# Patient Record
Sex: Female | Born: 1955 | Race: White | Hispanic: No | Marital: Married | Smoking: Never smoker
Health system: Southern US, Community
[De-identification: ages and names within clinical notes are randomized; demographics above are authoritative.]

## PROBLEM LIST (undated history)

## (undated) DIAGNOSIS — M1990 Unspecified osteoarthritis, unspecified site: Secondary | ICD-10-CM

## (undated) DIAGNOSIS — Z9889 Other specified postprocedural states: Secondary | ICD-10-CM

## (undated) DIAGNOSIS — R112 Nausea with vomiting, unspecified: Secondary | ICD-10-CM

## (undated) DIAGNOSIS — H5500 Unspecified nystagmus: Secondary | ICD-10-CM

## (undated) DIAGNOSIS — E119 Type 2 diabetes mellitus without complications: Secondary | ICD-10-CM

## (undated) DIAGNOSIS — R42 Dizziness and giddiness: Secondary | ICD-10-CM

## (undated) DIAGNOSIS — I10 Essential (primary) hypertension: Secondary | ICD-10-CM

## (undated) HISTORY — DX: Dizziness and giddiness: R42

## (undated) HISTORY — DX: Unspecified nystagmus: H55.00

## (undated) HISTORY — DX: Type 2 diabetes mellitus without complications: E11.9

## (undated) HISTORY — PX: TONSILLECTOMY: SUR1361

## (undated) HISTORY — PX: OTHER SURGICAL HISTORY: SHX169

## (undated) HISTORY — PX: ADENOIDECTOMY: SUR15

---

## 2001-05-31 ENCOUNTER — Encounter: Admission: RE | Admit: 2001-05-31 | Discharge: 2001-05-31 | Payer: Self-pay | Admitting: Family Medicine

## 2001-05-31 ENCOUNTER — Encounter: Payer: Self-pay | Admitting: Family Medicine

## 2003-06-03 ENCOUNTER — Encounter: Admission: RE | Admit: 2003-06-03 | Discharge: 2003-06-03 | Payer: Self-pay | Admitting: Rheumatology

## 2003-06-03 ENCOUNTER — Encounter: Payer: Self-pay | Admitting: Rheumatology

## 2006-03-02 ENCOUNTER — Encounter: Admission: RE | Admit: 2006-03-02 | Discharge: 2006-03-02 | Payer: Self-pay | Admitting: Orthopedic Surgery

## 2009-04-20 ENCOUNTER — Encounter: Admission: RE | Admit: 2009-04-20 | Discharge: 2009-04-20 | Payer: Self-pay | Admitting: Radiation Oncology

## 2009-11-20 HISTORY — PX: CERVICAL FUSION: SHX112

## 2010-05-26 ENCOUNTER — Encounter: Admission: RE | Admit: 2010-05-26 | Discharge: 2010-05-26 | Payer: Self-pay | Admitting: Neurosurgery

## 2010-07-29 ENCOUNTER — Ambulatory Visit (HOSPITAL_COMMUNITY): Admission: RE | Admit: 2010-07-29 | Discharge: 2010-07-30 | Payer: Self-pay | Admitting: Neurosurgery

## 2010-09-06 ENCOUNTER — Encounter: Admission: RE | Admit: 2010-09-06 | Discharge: 2010-09-06 | Payer: Self-pay | Admitting: Neurosurgery

## 2010-10-11 ENCOUNTER — Encounter: Admission: RE | Admit: 2010-10-11 | Discharge: 2010-10-11 | Payer: Self-pay | Admitting: Neurosurgery

## 2010-10-20 HISTORY — PX: OTHER SURGICAL HISTORY: SHX169

## 2010-12-13 ENCOUNTER — Encounter
Admission: RE | Admit: 2010-12-13 | Discharge: 2010-12-13 | Payer: Self-pay | Source: Home / Self Care | Attending: Neurosurgery | Admitting: Neurosurgery

## 2011-01-06 IMAGING — CR DG CERVICAL SPINE 1V
1 series · 1 of 1 positions shown · non-contrast
Comparison: 07/29/2010

CLINICAL DATA: Surgical cervical fusion

CERVICAL SPINE - 1 VIEW

[w c-spine lat]
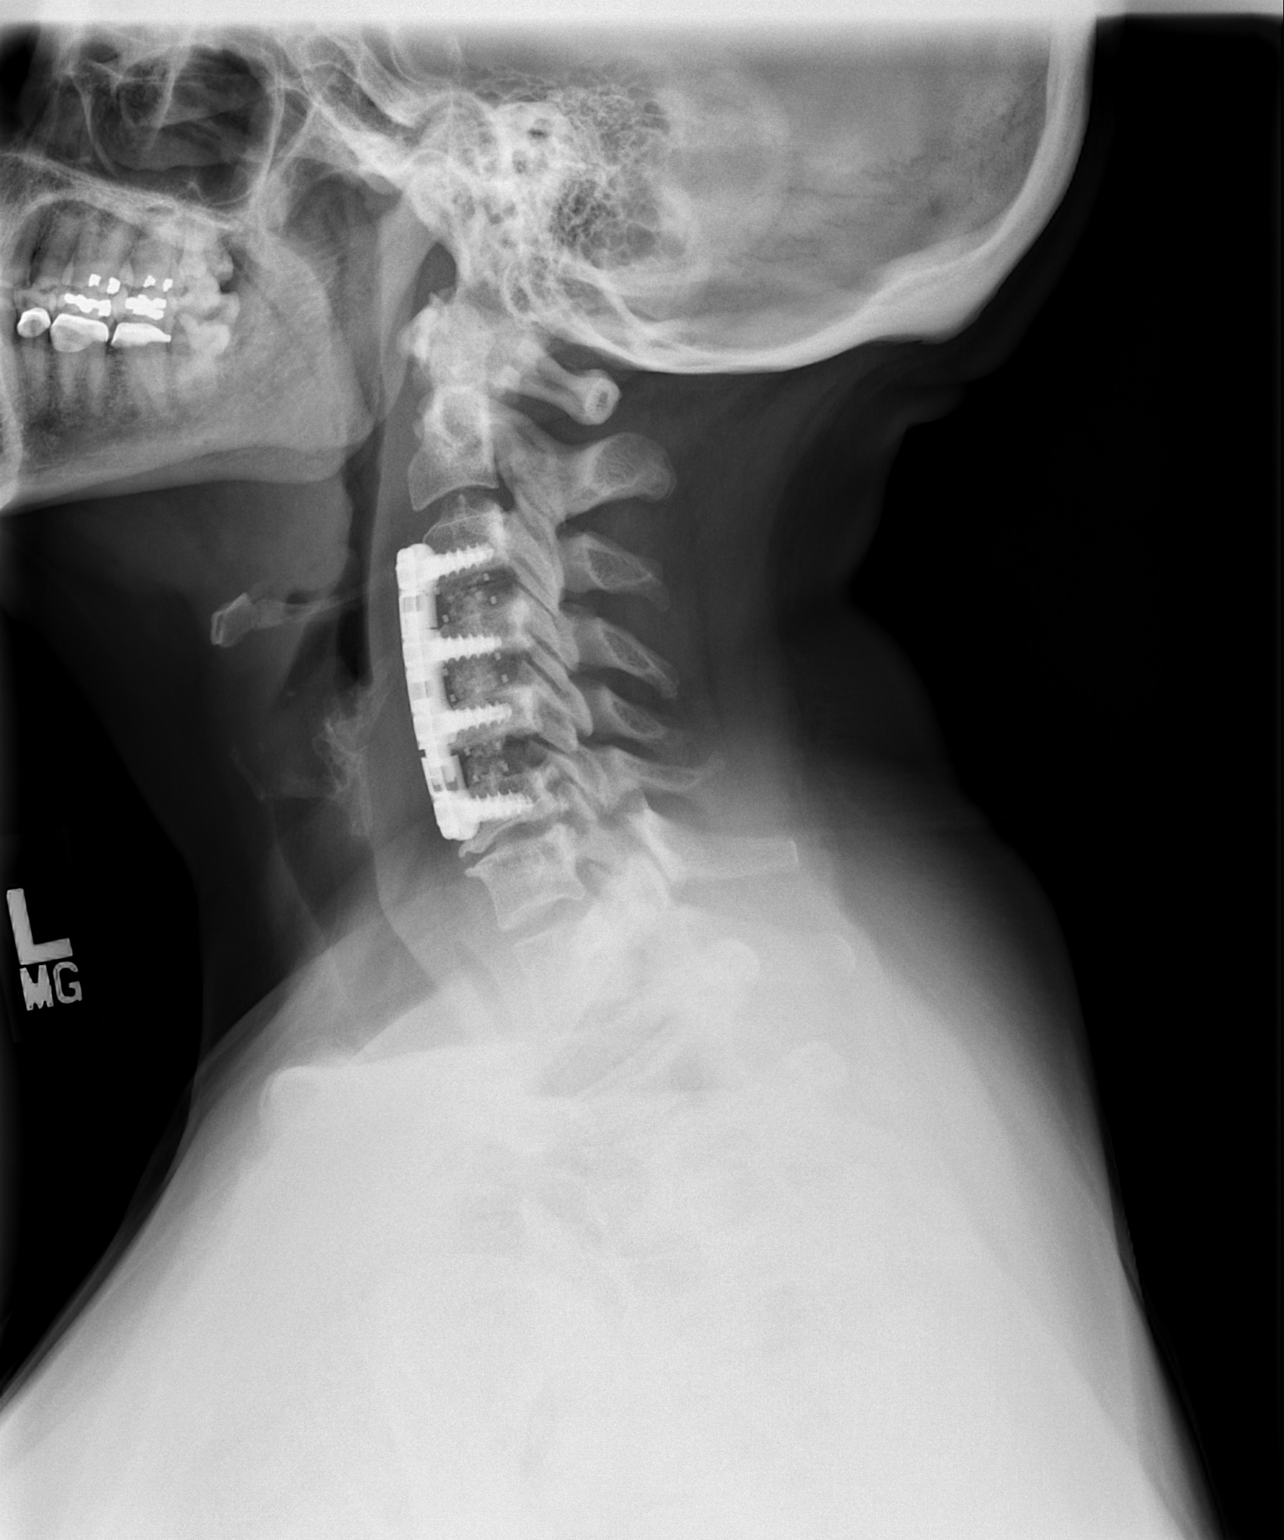

[1 of 1 positions shown; findings below may reference images not displayed]

FINDINGS: Single view demonstrates anterior plate and screws within
C3, C4, C5, and C6.  Interposed disc spacers are in place.  Based
on the lateral view, disc spacers and hardware are properly
positioned.  There is no breakage or loosening of the hardware.
Anatomic alignment.  There is significant narrowing of the C6-7
disc space with posterior osteophytic ridging.
IMPRESSION: Anterior discectomy and fusion C3-C6.  Anatomic alignment  and no
evidence of hardware failure.

C6-7 degenerative disc disease.

## 2011-02-02 LAB — CBC
HCT: 38.6 % (ref 36.0–46.0)
Hemoglobin: 12.8 g/dL (ref 12.0–15.0)
RBC: 4.16 MIL/uL (ref 3.87–5.11)

## 2011-02-02 LAB — BASIC METABOLIC PANEL
CO2: 29 mEq/L (ref 19–32)
Calcium: 9.3 mg/dL (ref 8.4–10.5)
GFR calc Af Amer: >60 mL/min (ref >60)
GFR calc non Af Amer: >60 mL/min (ref >60)
Potassium: 3.5 mEq/L (ref 3.5–5.1)
Sodium: 138 mEq/L (ref 135–145)

## 2011-02-02 LAB — SURGICAL PCR SCREEN
MRSA, PCR: NEGATIVE
Staphylococcus aureus: NEGATIVE

## 2011-04-04 ENCOUNTER — Other Ambulatory Visit: Payer: Self-pay | Admitting: Neurosurgery

## 2011-04-04 ENCOUNTER — Ambulatory Visit
Admission: RE | Admit: 2011-04-04 | Discharge: 2011-04-04 | Disposition: A | Discharge status: Home / Self Care | Payer: 59 | Source: Ambulatory Visit | Attending: Neurosurgery | Admitting: Neurosurgery

## 2011-04-04 DIAGNOSIS — Z9889 Other specified postprocedural states: Secondary | ICD-10-CM

## 2011-09-05 ENCOUNTER — Other Ambulatory Visit: Payer: Self-pay | Admitting: Neurosurgery

## 2011-09-05 ENCOUNTER — Ambulatory Visit
Admission: RE | Admit: 2011-09-05 | Discharge: 2011-09-05 | Disposition: A | Discharge status: Home / Self Care | Payer: 59 | Source: Ambulatory Visit | Attending: Neurosurgery | Admitting: Neurosurgery

## 2011-09-05 DIAGNOSIS — M542 Cervicalgia: Secondary | ICD-10-CM

## 2012-10-07 ENCOUNTER — Encounter (HOSPITAL_COMMUNITY): Payer: Self-pay | Admitting: Pharmacy Technician

## 2012-10-08 ENCOUNTER — Other Ambulatory Visit: Payer: Self-pay | Admitting: Neurosurgery

## 2012-10-08 ENCOUNTER — Ambulatory Visit
Admission: RE | Admit: 2012-10-08 | Discharge: 2012-10-08 | Disposition: A | Discharge status: Home / Self Care | Payer: 59 | Source: Ambulatory Visit | Attending: Neurosurgery | Admitting: Neurosurgery

## 2012-10-08 DIAGNOSIS — M5030 Other cervical disc degeneration, unspecified cervical region: Secondary | ICD-10-CM

## 2012-10-08 NOTE — Patient Instructions (Addendum)
[address]  10/08/2012   Your procedure is scheduled on: 10/15/12  Report to [facility] at 05:15 AM.  Call this number if you have problems the morning of surgery 336-: [phone]   Remember:   Do not eat food or drink liquids After Midnight.     Take these medicines the morning of surgery with A SIP OF WATER: no medications to take   See [facility] preparing for surgery sheet.  Do not wear jewelry, make-up or nail polish.  Do not wear lotions, powders, or perfumes. You may wear deodorant.  Do not shave 48 hours prior to surgery. Men may shave face and neck.  Do not bring valuables to the hospital.  Contacts, dentures or bridgework may not be worn into surgery.  Leave suitcase in the car. After surgery it may be brought to your room.  For patients admitted to the hospital, checkout time is 11:00 AM the day of discharge.   Patients discharged the day of surgery will not be allowed to drive home.  Name and phone number of your driver:  Special Instructions: Shower using CHG 2 nights before surgery and the night before surgery.  If you shower the day of surgery use CHG.  Use special wash - you have one bottle of CHG for all showers.  You should use approximately 1/3 of the bottle for each shower.   Please read over the following fact sheets that you were given: MRSA Information, blood fact sheet, incentive spirometer fact sheet Birdie Sons, RN  pre op nurse call if needed [phone]

## 2012-10-09 ENCOUNTER — Encounter (HOSPITAL_COMMUNITY)
Admission: RE | Admit: 2012-10-09 | Discharge: 2012-10-09 | Disposition: A | Discharge status: Home / Self Care | Payer: 59 | Source: Ambulatory Visit | Attending: Orthopedic Surgery | Admitting: Orthopedic Surgery

## 2012-10-09 ENCOUNTER — Ambulatory Visit (HOSPITAL_COMMUNITY)
Admission: RE | Admit: 2012-10-09 | Discharge: 2012-10-09 | Disposition: A | Discharge status: Home / Self Care | Payer: 59 | Source: Ambulatory Visit | Attending: Orthopedic Surgery | Admitting: Orthopedic Surgery

## 2012-10-09 ENCOUNTER — Encounter (HOSPITAL_COMMUNITY): Payer: Self-pay

## 2012-10-09 DIAGNOSIS — I10 Essential (primary) hypertension: Secondary | ICD-10-CM | POA: Insufficient documentation

## 2012-10-09 DIAGNOSIS — Z01812 Encounter for preprocedural laboratory examination: Secondary | ICD-10-CM | POA: Insufficient documentation

## 2012-10-09 DIAGNOSIS — M1710 Unilateral primary osteoarthritis, unspecified knee: Secondary | ICD-10-CM | POA: Insufficient documentation

## 2012-10-09 HISTORY — DX: Nausea with vomiting, unspecified: R11.2

## 2012-10-09 HISTORY — DX: Nausea with vomiting, unspecified: Z98.890

## 2012-10-09 HISTORY — DX: Unspecified osteoarthritis, unspecified site: M19.90

## 2012-10-09 HISTORY — DX: Essential (primary) hypertension: I10

## 2012-10-09 LAB — CBC
HCT: 39 % (ref 36.0–46.0)
Hemoglobin: 13.2 g/dL (ref 12.0–15.0)
RBC: 4.22 MIL/uL (ref 3.87–5.11)
WBC: 7.9 10*3/uL (ref 4.0–10.5)

## 2012-10-09 LAB — SURGICAL PCR SCREEN: Staphylococcus aureus: NEGATIVE

## 2012-10-09 LAB — URINALYSIS, ROUTINE W REFLEX MICROSCOPIC
Leukocytes, UA: NEGATIVE
Nitrite: NEGATIVE
Specific Gravity, Urine: 1.018 (ref 1.005–1.030)
Urobilinogen, UA: 0.2 mg/dL (ref 0.0–1.0)
pH: 5.5 (ref 5.0–8.0)

## 2012-10-09 LAB — PROTIME-INR: INR: 0.93 (ref 0.00–1.49)

## 2012-10-09 LAB — APTT: aPTT: 35 seconds (ref 24–37)

## 2012-10-09 LAB — BASIC METABOLIC PANEL
Chloride: 100 mEq/L (ref 96–112)
GFR calc Af Amer: >90 mL/min (ref >90)
Potassium: 4.6 mEq/L (ref 3.5–5.1)
Sodium: 137 mEq/L (ref 135–145)

## 2012-10-09 NOTE — Progress Notes (Signed)
EKG on 09/25/12 from Dr. Zachery Dauer on chart.

## 2012-10-09 NOTE — Progress Notes (Signed)
10/09/12 0851  OBSTRUCTIVE SLEEP APNEA  Have you ever been diagnosed with sleep apnea through a sleep study? No  Do you snore loudly (loud enough to be heard through closed doors)?  1  Do you often feel tired, fatigued, or sleepy during the daytime? 0  Has anyone observed you stop breathing during your sleep? 0  Do you have, or are you being treated for high blood pressure? 1  BMI more than 35 kg/m2? 1  Age over 56 years old? 1  Neck circumference greater than 40 cm/18 inches? 0  Gender: 0  Obstructive Sleep Apnea Score 4   Score 4 or greater  Results sent to PCP

## 2012-10-13 NOTE — H&P (Signed)
TOTAL KNEE ADMISSION H&P  Patient is being admitted for right total knee arthroplasty and left knee arthroscopic debridement.  Subjective:  Chief Complaint:bilaterally knee OA / pain.  HPI: Betty Robinson, 56 y.o. female, has a history of pain and functional disability in the right knees due to arthritis and has failed non-surgical conservative treatments for greater than 12 weeks to includeNSAID's and/or analgesics, corticosteriod injections, viscosupplementation injections and activity modification.  Onset of symptoms was gradual, starting 5 years ago with gradually worsening course since that time. The patient noted no past surgery on the right knee(s).  Patient currently rates pain in the right knee(s) at 8 out of 10 with activity. Patient has worsening of pain with activity and weight bearing, pain that interferes with activities of daily living, crepitus and joint swelling.  Patient has evidence of periarticular osteophytes and joint space narrowing by imaging studies.  There is no active infection. Also complaining of left knee pain for about 1 year. Had a previous arthroscopy to clean up the left knee. Studies reveal preserved joint space within the left knee. Previous arthroscopy revealed chondral injuries and possible worsening of the disease is causing the more recent pain. She wishes to proceed with a right total knee arthroplasty and left knee arthroscopy. Risks, benefits and expectations were discussed with the patient. Patient understand the risks, benefits and expectations and wishes to proceed with surgery.    D/C Plans:  Home with HHPT  Post-op Meds:   Rx given for ASA, Robaxin, Celebrex, Iron, Colace and MiraLax  Tranexamic Acid:  To be given  Decadron:   To be given   Past Medical History  Diagnosis Date  . Hypertension   . Arthritis   . PONV (postoperative nausea and vomiting)     limited turning neck side to side since cervical fusion    Past Surgical History  Procedure  Date  . Cervical fusion 2011    C2 C3 C4  . Bilateral knee arthroscopic R 6 years ago L June 2013  . Left foot surgery Dec. 2011    No prescriptions prior to admission   No Known Allergies  History  Substance Use Topics  . Smoking status: Never Smoker   . Smokeless tobacco: Never Used  . Alcohol Use: Yes     Comment: rare       Review of Systems  Constitutional: Negative.   HENT: Negative.   Eyes: Negative.   Respiratory: Negative.   Cardiovascular: Negative.   Gastrointestinal: Negative.   Genitourinary: Negative.   Musculoskeletal: Positive for joint pain.  Skin: Negative.   Neurological: Negative.   Endo/Heme/Allergies: Negative.   Psychiatric/Behavioral: Negative.     Objective:  Physical Exam  Constitutional: She is oriented to person, place, and time. She appears well-developed and well-nourished.  HENT:  Head: Normocephalic and atraumatic.  Mouth/Throat: Oropharynx is clear and moist.  Eyes: Pupils are equal, round, and reactive to light.  Neck: Neck supple. No JVD present. No tracheal deviation present. No thyromegaly present.  Cardiovascular: Normal rate, regular rhythm and intact distal pulses.   Murmur heard. Respiratory: Effort normal and breath sounds normal. No stridor. No respiratory distress. She has no wheezes.  GI: Soft. There is no tenderness. There is no guarding.  Musculoskeletal:       Right knee: She exhibits decreased range of motion, swelling, abnormal alignment and bony tenderness. She exhibits no effusion, no laceration and no erythema. tenderness found. Medial joint line tenderness noted.  Left knee: She exhibits decreased range of motion, swelling, abnormal alignment and bony tenderness. She exhibits no effusion and no ecchymosis. tenderness found. Medial joint line tenderness noted.  Lymphadenopathy:    She has no cervical adenopathy.  Neurological: She is alert and oriented to person, place, and time.  Skin: Skin is warm and dry.   Psychiatric: She has a normal mood and affect.      Imaging Review Plain radiographs demonstrate severe degenerative joint disease of the right knee(s). The overall alignment is mild varus. The bone quality appears to be good for age and reported activity level.  Assessment/Plan:  End stage arthritis, right knee and arthritic changes of the left knee  The patient history, physical examination, clinical judgment of the provider and imaging studies are consistent with end stage degenerative joint disease of the bilaterally knee(s) and total knee arthroplasty is deemed medically necessary. The treatment options including medical management, injection therapy arthroscopy and arthroplasty were discussed at length. The risks and benefits of total knee arthroplasty were presented and reviewed. The risks due to aseptic loosening, infection, stiffness, patella tracking problems, thromboembolic complications and other imponderables were discussed. The patient acknowledged the explanation, agreed to proceed with the plan and consent was signed. Patient is being admitted for inpatient treatment for surgery, pain control, PT, OT, prophylactic antibiotics, VTE prophylaxis, progressive ambulation and ADL's and discharge planning. The patient is planning to be discharged home with home health services.     Anastasio Auerbach Demetrias Goodbar   PAC  10/13/2012, 4:46 PM

## 2012-10-15 ENCOUNTER — Ambulatory Visit (HOSPITAL_COMMUNITY): Payer: 59 | Admitting: Registered Nurse

## 2012-10-15 ENCOUNTER — Encounter (HOSPITAL_COMMUNITY): Payer: Self-pay | Admitting: Registered Nurse

## 2012-10-15 ENCOUNTER — Encounter (HOSPITAL_COMMUNITY)
Admission: RE | Disposition: A | Discharge status: Home Health | Payer: Self-pay | Source: Ambulatory Visit | Attending: Orthopedic Surgery

## 2012-10-15 ENCOUNTER — Encounter (HOSPITAL_COMMUNITY): Payer: Self-pay | Admitting: Orthopedic Surgery

## 2012-10-15 ENCOUNTER — Inpatient Hospital Stay (HOSPITAL_COMMUNITY)
Admission: RE | Admit: 2012-10-15 | Discharge: 2012-10-17 | DRG: 470 | Disposition: A | Discharge status: Home Health | Payer: 59 | Source: Ambulatory Visit | Attending: Orthopedic Surgery | Admitting: Orthopedic Surgery

## 2012-10-15 ENCOUNTER — Encounter (HOSPITAL_COMMUNITY): Payer: Self-pay | Admitting: *Deleted

## 2012-10-15 DIAGNOSIS — D62 Acute posthemorrhagic anemia: Secondary | ICD-10-CM | POA: Diagnosis not present

## 2012-10-15 DIAGNOSIS — Z96659 Presence of unspecified artificial knee joint: Secondary | ICD-10-CM

## 2012-10-15 DIAGNOSIS — Z6839 Body mass index (BMI) 39.0-39.9, adult: Secondary | ICD-10-CM

## 2012-10-15 DIAGNOSIS — D500 Iron deficiency anemia secondary to blood loss (chronic): Secondary | ICD-10-CM | POA: Diagnosis not present

## 2012-10-15 DIAGNOSIS — Z9889 Other specified postprocedural states: Secondary | ICD-10-CM

## 2012-10-15 DIAGNOSIS — I10 Essential (primary) hypertension: Secondary | ICD-10-CM | POA: Diagnosis present

## 2012-10-15 DIAGNOSIS — E669 Obesity, unspecified: Secondary | ICD-10-CM | POA: Diagnosis present

## 2012-10-15 DIAGNOSIS — M1710 Unilateral primary osteoarthritis, unspecified knee: Principal | ICD-10-CM | POA: Diagnosis present

## 2012-10-15 DIAGNOSIS — M2240 Chondromalacia patellae, unspecified knee: Secondary | ICD-10-CM | POA: Diagnosis present

## 2012-10-15 DIAGNOSIS — M23359 Other meniscus derangements, posterior horn of lateral meniscus, unspecified knee: Secondary | ICD-10-CM | POA: Diagnosis present

## 2012-10-15 HISTORY — PX: KNEE ARTHROSCOPY: SHX127

## 2012-10-15 HISTORY — PX: TOTAL KNEE ARTHROPLASTY: SHX125

## 2012-10-15 LAB — TYPE AND SCREEN: ABO/RH(D): O POS

## 2012-10-15 LAB — ABO/RH: ABO/RH(D): O POS

## 2012-10-15 SURGERY — ARTHROPLASTY, KNEE, TOTAL
Anesthesia: Spinal | Site: Knee | Laterality: Right | Wound class: Clean

## 2012-10-15 MED ORDER — KETAMINE HCL 10 MG/ML IJ SOLN
INTRAMUSCULAR | Status: DC | PRN
  Administered 2012-10-15 (×2): 20 mg via INTRAVENOUS
  Administered 2012-10-15: 10 mg via INTRAVENOUS

## 2012-10-15 MED ORDER — LISINOPRIL 10 MG PO TABS
10.0000 mg | ORAL_TABLET | Freq: Every day | ORAL | Status: DC
  Administered 2012-10-16: 10 mg via ORAL
  Filled 2012-10-15 (×2): qty 1

## 2012-10-15 MED ORDER — POLYETHYLENE GLYCOL 3350 17 G PO PACK: 17.0000 g | PACK | Freq: Every day | ORAL | Status: DC | PRN

## 2012-10-15 MED ORDER — LIDOCAINE-EPINEPHRINE 1 %-1:100000 IJ SOLN
INTRAMUSCULAR | Status: AC
  Filled 2012-10-15: qty 1

## 2012-10-15 MED ORDER — TRANEXAMIC ACID 100 MG/ML IV SOLN
1420.0000 mg | Freq: Once | INTRAVENOUS | Status: AC
  Administered 2012-10-15: 1420 mg via INTRAVENOUS
  Filled 2012-10-15: qty 14.2

## 2012-10-15 MED ORDER — 0.9 % SODIUM CHLORIDE (POUR BTL) OPTIME
TOPICAL | Status: DC | PRN
  Administered 2012-10-15: 1000 mL

## 2012-10-15 MED ORDER — HYDROMORPHONE HCL PF 1 MG/ML IJ SOLN
0.0500 mg | INTRAMUSCULAR | Status: DC | PRN
  Administered 2012-10-15 (×2): 1 mg via INTRAVENOUS
  Administered 2012-10-16: 0.5 mg via INTRAVENOUS
  Filled 2012-10-15 (×3): qty 1

## 2012-10-15 MED ORDER — METHOCARBAMOL 100 MG/ML IJ SOLN
500.0000 mg | Freq: Four times a day (QID) | INTRAVENOUS | Status: DC | PRN
  Administered 2012-10-15: 500 mg via INTRAVENOUS
  Filled 2012-10-15: qty 5

## 2012-10-15 MED ORDER — HYDROCHLOROTHIAZIDE 12.5 MG PO CAPS
12.5000 mg | ORAL_CAPSULE | Freq: Every day | ORAL | Status: DC
  Administered 2012-10-16: 12.5 mg via ORAL
  Filled 2012-10-15 (×2): qty 1

## 2012-10-15 MED ORDER — PHENOL 1.4 % MT LIQD: 1.0000 | OROMUCOSAL | Status: DC | PRN

## 2012-10-15 MED ORDER — ACETAMINOPHEN 10 MG/ML IV SOLN
INTRAVENOUS | Status: AC
  Filled 2012-10-15: qty 100

## 2012-10-15 MED ORDER — AMLODIPINE BESYLATE 5 MG PO TABS
5.0000 mg | ORAL_TABLET | Freq: Every evening | ORAL | Status: DC
Start: 2012-10-15 — End: 2012-10-17
  Administered 2012-10-15 – 2012-10-16 (×2): 5 mg via ORAL
  Filled 2012-10-15 (×3): qty 1

## 2012-10-15 MED ORDER — MIDAZOLAM HCL 5 MG/5ML IJ SOLN
INTRAMUSCULAR | Status: DC | PRN
  Administered 2012-10-15: 2 mg via INTRAVENOUS
  Administered 2012-10-15 (×2): 1 mg via INTRAVENOUS

## 2012-10-15 MED ORDER — SODIUM CHLORIDE 0.9 % IR SOLN
Status: DC | PRN
  Administered 2012-10-15: 3000 mL

## 2012-10-15 MED ORDER — HYDROMORPHONE HCL PF 1 MG/ML IJ SOLN
0.2000 mg | INTRAMUSCULAR | Status: DC | PRN
  Administered 2012-10-15: 0.6 mg via INTRAVENOUS
  Filled 2012-10-15: qty 1

## 2012-10-15 MED ORDER — ALUM & MAG HYDROXIDE-SIMETH 200-200-20 MG/5ML PO SUSP: 30.0000 mL | ORAL | Status: DC | PRN

## 2012-10-15 MED ORDER — LIDOCAINE-EPINEPHRINE 1 %-1:100000 IJ SOLN
INTRAMUSCULAR | Status: DC | PRN
  Administered 2012-10-15: 30 mL

## 2012-10-15 MED ORDER — CEFAZOLIN SODIUM-DEXTROSE 2-3 GM-% IV SOLR
INTRAVENOUS | Status: AC
  Filled 2012-10-15: qty 50

## 2012-10-15 MED ORDER — METHOCARBAMOL 500 MG PO TABS
500.0000 mg | ORAL_TABLET | Freq: Four times a day (QID) | ORAL | Status: DC | PRN
  Administered 2012-10-15 – 2012-10-17 (×4): 500 mg via ORAL
  Filled 2012-10-15 (×4): qty 1

## 2012-10-15 MED ORDER — KETOROLAC TROMETHAMINE 30 MG/ML IJ SOLN
INTRAMUSCULAR | Status: DC | PRN
  Administered 2012-10-15: 30 mg via INTRAVENOUS

## 2012-10-15 MED ORDER — BUPIVACAINE-EPINEPHRINE PF 0.25-1:200000 % IJ SOLN
INTRAMUSCULAR | Status: DC | PRN
  Administered 2012-10-15: 30 mL

## 2012-10-15 MED ORDER — PROMETHAZINE HCL 25 MG/ML IJ SOLN: 6.2500 mg | INTRAMUSCULAR | Status: DC | PRN

## 2012-10-15 MED ORDER — SODIUM CHLORIDE 0.9 % IV SOLN
INTRAVENOUS | Status: DC
  Administered 2012-10-15 – 2012-10-16 (×3): via INTRAVENOUS
  Filled 2012-10-15 (×8): qty 1000

## 2012-10-15 MED ORDER — ONDANSETRON HCL 4 MG/2ML IJ SOLN
INTRAMUSCULAR | Status: DC | PRN
  Administered 2012-10-15: 4 mg via INTRAVENOUS

## 2012-10-15 MED ORDER — HYDROMORPHONE HCL PF 1 MG/ML IJ SOLN
0.2500 mg | INTRAMUSCULAR | Status: DC | PRN
  Administered 2012-10-15: 0.5 mg via INTRAVENOUS

## 2012-10-15 MED ORDER — HYDROCODONE-ACETAMINOPHEN 7.5-325 MG PO TABS
1.0000 | ORAL_TABLET | ORAL | Status: DC
  Administered 2012-10-15 – 2012-10-16 (×7): 2 via ORAL
  Administered 2012-10-16 (×2): 1 via ORAL
  Administered 2012-10-17 (×2): 2 via ORAL
  Filled 2012-10-15: qty 1
  Filled 2012-10-15 (×2): qty 2
  Filled 2012-10-15: qty 1
  Filled 2012-10-15 (×7): qty 2

## 2012-10-15 MED ORDER — LIDOCAINE HCL (CARDIAC) 20 MG/ML IV SOLN
INTRAVENOUS | Status: DC | PRN
  Administered 2012-10-15: 100 mg via INTRAVENOUS

## 2012-10-15 MED ORDER — LACTATED RINGERS IV SOLN
INTRAVENOUS | Status: DC | PRN
  Administered 2012-10-15: 07:00:00 via INTRAVENOUS

## 2012-10-15 MED ORDER — QUINAPRIL-HYDROCHLOROTHIAZIDE 10-12.5 MG PO TABS: 1.0000 | ORAL_TABLET | Freq: Every day | ORAL | Status: DC

## 2012-10-15 MED ORDER — SENNA 8.6 MG PO TABS
1.0000 | ORAL_TABLET | Freq: Two times a day (BID) | ORAL | Status: DC
  Administered 2012-10-15 – 2012-10-16 (×4): 8.6 mg via ORAL
  Filled 2012-10-15 (×4): qty 1

## 2012-10-15 MED ORDER — PROPOFOL INFUSION 10 MG/ML OPTIME
INTRAVENOUS | Status: DC | PRN
  Administered 2012-10-15: 75 ug/kg/min via INTRAVENOUS

## 2012-10-15 MED ORDER — DEXAMETHASONE SODIUM PHOSPHATE 10 MG/ML IJ SOLN
10.0000 mg | Freq: Once | INTRAMUSCULAR | Status: AC
  Administered 2012-10-15: 10 mg via INTRAVENOUS

## 2012-10-15 MED ORDER — ONDANSETRON HCL 4 MG PO TABS
4.0000 mg | ORAL_TABLET | Freq: Four times a day (QID) | ORAL | Status: DC | PRN
  Administered 2012-10-17: 4 mg via ORAL
  Filled 2012-10-15: qty 1

## 2012-10-15 MED ORDER — ONDANSETRON HCL 4 MG/2ML IJ SOLN
4.0000 mg | Freq: Four times a day (QID) | INTRAMUSCULAR | Status: DC | PRN
  Administered 2012-10-15 – 2012-10-16 (×2): 4 mg via INTRAVENOUS
  Filled 2012-10-15 (×2): qty 2

## 2012-10-15 MED ORDER — ZOLPIDEM TARTRATE 5 MG PO TABS: 5.0000 mg | ORAL_TABLET | Freq: Every evening | ORAL | Status: DC | PRN

## 2012-10-15 MED ORDER — HYDROMORPHONE HCL PF 1 MG/ML IJ SOLN
INTRAMUSCULAR | Status: AC
  Administered 2012-10-15: 0.6 mg via INTRAVENOUS
  Filled 2012-10-15: qty 1

## 2012-10-15 MED ORDER — KETOROLAC TROMETHAMINE 30 MG/ML IJ SOLN
INTRAMUSCULAR | Status: AC
  Filled 2012-10-15: qty 1

## 2012-10-15 MED ORDER — FENTANYL CITRATE 0.05 MG/ML IJ SOLN
INTRAMUSCULAR | Status: DC | PRN
  Administered 2012-10-15: 25 ug via INTRAVENOUS
  Administered 2012-10-15: 50 ug via INTRAVENOUS

## 2012-10-15 MED ORDER — DIPHENHYDRAMINE HCL 12.5 MG/5ML PO ELIX: 25.0000 mg | ORAL_SOLUTION | Freq: Four times a day (QID) | ORAL | Status: DC | PRN

## 2012-10-15 MED ORDER — FERROUS SULFATE 325 (65 FE) MG PO TABS
325.0000 mg | ORAL_TABLET | Freq: Three times a day (TID) | ORAL | Status: DC
  Administered 2012-10-15 – 2012-10-17 (×3): 325 mg via ORAL
  Filled 2012-10-15 (×9): qty 1

## 2012-10-15 MED ORDER — DOCUSATE SODIUM 100 MG PO CAPS
100.0000 mg | ORAL_CAPSULE | Freq: Two times a day (BID) | ORAL | Status: DC
  Administered 2012-10-15 – 2012-10-16 (×3): 100 mg via ORAL

## 2012-10-15 MED ORDER — BUPIVACAINE-EPINEPHRINE 0.25% -1:200000 IJ SOLN
INTRAMUSCULAR | Status: DC | PRN
  Administered 2012-10-15: 60 mL

## 2012-10-15 MED ORDER — LACTATED RINGERS IR SOLN
Status: DC | PRN
  Administered 2012-10-15 (×3): 3000 mL

## 2012-10-15 MED ORDER — CEFAZOLIN SODIUM-DEXTROSE 2-3 GM-% IV SOLR
2.0000 g | INTRAVENOUS | Status: AC
  Administered 2012-10-15: 2 g via INTRAVENOUS

## 2012-10-15 MED ORDER — BUPIVACAINE IN DEXTROSE 0.75-8.25 % IT SOLN
INTRATHECAL | Status: DC | PRN
  Administered 2012-10-15: 2 mL via INTRATHECAL

## 2012-10-15 MED ORDER — RIVAROXABAN 10 MG PO TABS
10.0000 mg | ORAL_TABLET | Freq: Every day | ORAL | Status: DC
  Administered 2012-10-16 – 2012-10-17 (×2): 10 mg via ORAL
  Filled 2012-10-15 (×3): qty 1

## 2012-10-15 MED ORDER — BUPIVACAINE-EPINEPHRINE PF 0.25-1:200000 % IJ SOLN
INTRAMUSCULAR | Status: AC
  Filled 2012-10-15: qty 90

## 2012-10-15 MED ORDER — CEFAZOLIN SODIUM-DEXTROSE 2-3 GM-% IV SOLR
2.0000 g | Freq: Four times a day (QID) | INTRAVENOUS | Status: AC
  Administered 2012-10-15 (×2): 2 g via INTRAVENOUS
  Filled 2012-10-15 (×3): qty 50

## 2012-10-15 MED ORDER — MENTHOL 3 MG MT LOZG: 1.0000 | LOZENGE | OROMUCOSAL | Status: DC | PRN

## 2012-10-15 MED ORDER — ACETAMINOPHEN 10 MG/ML IV SOLN
INTRAVENOUS | Status: DC | PRN
  Administered 2012-10-15: 1000 mg via INTRAVENOUS

## 2012-10-15 SURGICAL SUPPLY — 68 items
ADH SKN CLS APL DERMABOND .7 (GAUZE/BANDAGES/DRESSINGS) ×2
BAG SPEC THK2 15X12 ZIP CLS (MISCELLANEOUS) ×2
BAG ZIPLOCK 12X15 (MISCELLANEOUS) ×3 IMPLANT
BANDAGE ELASTIC 6 VELCRO ST LF (GAUZE/BANDAGES/DRESSINGS) ×4 IMPLANT
BANDAGE ESMARK 6X9 LF (GAUZE/BANDAGES/DRESSINGS) ×2 IMPLANT
BANDAGE GAUZE ELAST BULKY 4 IN (GAUZE/BANDAGES/DRESSINGS) ×1 IMPLANT
BLADE CUDA SHAVER 3.5 (BLADE) ×3 IMPLANT
BLADE SAW SGTL 13.0X1.19X90.0M (BLADE) ×3 IMPLANT
BNDG CMPR 9X6 STRL LF SNTH (GAUZE/BANDAGES/DRESSINGS) ×2
BNDG ESMARK 6X9 LF (GAUZE/BANDAGES/DRESSINGS) ×3
BOWL SMART MIX CTS (DISPOSABLE) ×3 IMPLANT
CEMENT HV SMART SET (Cement) ×2 IMPLANT
CLOTH BEACON ORANGE TIMEOUT ST (SAFETY) ×3 IMPLANT
CUFF TOURN SGL QUICK 34 (TOURNIQUET CUFF) ×3
CUFF TRNQT CYL 34X4X40X1 (TOURNIQUET CUFF) ×2 IMPLANT
DECANTER SPIKE VIAL GLASS SM (MISCELLANEOUS) ×3 IMPLANT
DERMABOND ADVANCED (GAUZE/BANDAGES/DRESSINGS) ×1
DERMABOND ADVANCED .7 DNX12 (GAUZE/BANDAGES/DRESSINGS) ×2 IMPLANT
DRAPE EXTREMITY T 121X128X90 (DRAPE) ×3 IMPLANT
DRAPE POUCH INSTRU U-SHP 10X18 (DRAPES) ×3 IMPLANT
DRAPE U-SHAPE 47X51 STRL (DRAPES) ×3 IMPLANT
DRSG AQUACEL AG ADV 3.5X10 (GAUZE/BANDAGES/DRESSINGS) ×3 IMPLANT
DRSG EMULSION OIL 3X3 NADH (GAUZE/BANDAGES/DRESSINGS) ×3 IMPLANT
DRSG TEGADERM 4X4.75 (GAUZE/BANDAGES/DRESSINGS) ×3 IMPLANT
DURAPREP 26ML APPLICATOR (WOUND CARE) ×3 IMPLANT
ELECT REM PT RETURN 9FT ADLT (ELECTROSURGICAL) ×3
ELECTRODE REM PT RTRN 9FT ADLT (ELECTROSURGICAL) ×2 IMPLANT
EVACUATOR 1/8 PVC DRAIN (DRAIN) ×3 IMPLANT
FACESHIELD LNG OPTICON STERILE (SAFETY) ×15 IMPLANT
GAUZE SPONGE 2X2 8PLY STRL LF (GAUZE/BANDAGES/DRESSINGS) ×2 IMPLANT
GLOVE BIOGEL PI IND STRL 7.5 (GLOVE) ×2 IMPLANT
GLOVE BIOGEL PI IND STRL 8 (GLOVE) ×2 IMPLANT
GLOVE BIOGEL PI INDICATOR 7.5 (GLOVE) ×1
GLOVE BIOGEL PI INDICATOR 8 (GLOVE) ×1
GLOVE ECLIPSE 8.0 STRL XLNG CF (GLOVE) ×3 IMPLANT
GLOVE ORTHO TXT STRL SZ7.5 (GLOVE) ×6 IMPLANT
GOWN BRE IMP PREV XXLGXLNG (GOWN DISPOSABLE) ×6 IMPLANT
GOWN STRL NON-REIN LRG LVL3 (GOWN DISPOSABLE) ×3 IMPLANT
HANDPIECE INTERPULSE COAX TIP (DISPOSABLE) ×3
IMMOBILIZER KNEE 20 (SOFTGOODS) ×3
IMMOBILIZER KNEE 20 THIGH 36 (SOFTGOODS) IMPLANT
KIT BASIN OR (CUSTOM PROCEDURE TRAY) ×3 IMPLANT
MANIFOLD NEPTUNE II (INSTRUMENTS) ×5 IMPLANT
NDL SAFETY ECLIPSE 18X1.5 (NEEDLE) ×2 IMPLANT
NEEDLE HYPO 18GX1.5 SHARP (NEEDLE) ×3
NS IRRIG 1000ML POUR BTL (IV SOLUTION) ×3 IMPLANT
PACK ARTHROSCOPY WL (CUSTOM PROCEDURE TRAY) ×3 IMPLANT
PACK TOTAL JOINT (CUSTOM PROCEDURE TRAY) ×3 IMPLANT
PADDING CAST COTTON 6X4 STRL (CAST SUPPLIES) ×3 IMPLANT
POSITIONER SURGICAL ARM (MISCELLANEOUS) ×3 IMPLANT
SET ARTHROSCOPY TUBING (MISCELLANEOUS) ×3
SET ARTHROSCOPY TUBING LN (MISCELLANEOUS) ×2 IMPLANT
SET HNDPC FAN SPRY TIP SCT (DISPOSABLE) ×2 IMPLANT
SET PAD KNEE POSITIONER (MISCELLANEOUS) ×3 IMPLANT
SPONGE GAUZE 2X2 STER 10/PKG (GAUZE/BANDAGES/DRESSINGS) ×1
SPONGE GAUZE 4X4 12PLY (GAUZE/BANDAGES/DRESSINGS) ×1 IMPLANT
SUCTION FRAZIER 12FR DISP (SUCTIONS) ×3 IMPLANT
SUT ETHILON 4 0 PS 2 18 (SUTURE) ×3 IMPLANT
SUT MNCRL AB 4-0 PS2 18 (SUTURE) ×3 IMPLANT
SUT VIC AB 1 CT1 36 (SUTURE) ×7 IMPLANT
SUT VIC AB 2-0 CT1 27 (SUTURE) ×9
SUT VIC AB 2-0 CT1 TAPERPNT 27 (SUTURE) ×6 IMPLANT
SUT VLOC 180 0 24IN GS25 (SUTURE) ×1 IMPLANT
SYR 50ML LL SCALE MARK (SYRINGE) ×3 IMPLANT
TOWEL OR 17X26 10 PK STRL BLUE (TOWEL DISPOSABLE) ×6 IMPLANT
TRAY FOLEY CATH 14FRSI W/METER (CATHETERS) ×3 IMPLANT
WATER STERILE IRR 1500ML POUR (IV SOLUTION) ×3 IMPLANT
WRAP KNEE MAXI GEL POST OP (GAUZE/BANDAGES/DRESSINGS) ×4 IMPLANT

## 2012-10-15 NOTE — Anesthesia Preprocedure Evaluation (Addendum)
Anesthesia Evaluation  Patient identified by MRN, date of birth, ID band Patient awake    Reviewed: Allergy & Precautions, H&P , NPO status , Patient's Chart, lab work & pertinent test results, reviewed documented beta blocker date and time   History of Anesthesia Complications (+) PONV and AWARENESS UNDER ANESTHESIA  Airway Mallampati: II TM Distance: >3 FB Neck ROM: full    Dental No notable dental hx.    Pulmonary neg pulmonary ROS,  breath sounds clear to auscultation  Pulmonary exam normal       Cardiovascular Exercise Tolerance: Good hypertension, Rhythm:regular Rate:Normal     Neuro/Psych negative neurological ROS  negative psych ROS   GI/Hepatic negative GI ROS, Neg liver ROS,   Endo/Other  negative endocrine ROS  Renal/GU negative Renal ROS  negative genitourinary   Musculoskeletal negative musculoskeletal ROS (+)   Abdominal   Peds negative pediatric ROS (+)  Hematology negative hematology ROS (+)   Anesthesia Other Findings   Reproductive/Obstetrics negative OB ROS                          Anesthesia Physical Anesthesia Plan  ASA: II  Anesthesia Plan: Spinal   Post-op Pain Management:    Induction:   Airway Management Planned:   Additional Equipment:   Intra-op Plan:   Post-operative Plan:   Informed Consent: I have reviewed the patients History and Physical, chart, labs and discussed the procedure including the risks, benefits and alternatives for the proposed anesthesia with the patient or authorized representative who has indicated his/her understanding and acceptance.   Dental Advisory Given  Plan Discussed with: CRNA  Anesthesia Plan Comments: (Discussed risks/benefits of spinal including headache, backache, failure, bleeding, infection, and nerve damage. Patient consents to spinal. Questions answered. Coagulation studies and platelet count acceptable. )        Anesthesia Quick Evaluation

## 2012-10-15 NOTE — Preoperative (Signed)
Beta Blockers   Reason not to administer Beta Blockers:Not Applicable 

## 2012-10-15 NOTE — Interval H&P Note (Signed)
History and Physical Interval Note:  10/15/2012 6:59 AM  Betty Robinson  has presented today for surgery, with the diagnosis of Left Knee/Right Knee Osteoarthritis  The various methods of treatment have been discussed with the patient and family. After consideration of risks, benefits and other options for treatment, the patient has consented to  Procedure(s) (LRB) with comments: TOTAL KNEE ARTHROPLASTY (Right) - Right Total Knee Arthroplasty/Left Knee Arthroscopy ARTHROSCOPY KNEE (Left) as a surgical intervention .  The patient's history has been reviewed, patient examined, no change in status, stable for surgery.  I have reviewed the patient's chart and labs.  Questions were answered to the patient's satisfaction.     [name]

## 2012-10-15 NOTE — Anesthesia Procedure Notes (Signed)
Spinal  Patient location during procedure: OR Staffing Anesthesiologist: Azell Der Performed by: anesthesiologist  Preanesthetic Checklist Completed: patient identified, site marked, surgical consent, pre-op evaluation, timeout performed, IV checked, risks and benefits discussed and monitors and equipment checked Spinal Block Patient position: sitting Prep: Betadine Patient monitoring: heart rate, cardiac monitor, continuous pulse ox and blood pressure Approach: midline Location: L3-4 Injection technique: single-shot Needle Needle type: Sprotte  Needle gauge: 24 G Additional Notes Clear CSF obtained. No paresthesia. Tolerated well. Adequate level.

## 2012-10-15 NOTE — Progress Notes (Addendum)
Physical Therapy Note  Order received. Attempted POD 0 eval-pt declined to participate this evening. Pt vomited while therapist was in room. RN notified. Will check back on tomorrow. Thanks. Rebeca Alert, PT [phone]

## 2012-10-15 NOTE — Op Note (Signed)
[name], Robinson NO.:  [id number]  MEDICAL RECORD NO.:  [id number]  LOCATION:  [facility]                         FACILITY:  [facility]  PHYSICIAN:  Madlyn Frankel. Charlann Boxer, M.D.  DATE OF BIRTH:  1956-03-14  DATE OF PROCEDURE:  10/15/2012 DATE OF DISCHARGE:                              OPERATIVE REPORT   PREOPERATIVE DIAGNOSIS:  Left knee progressive chondromalacia, rule out meniscal tear.  POSTOPERATIVE DIAGNOSES/FINDINGS: 1. 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 2. Lateral meniscal tear in the posterior horn of central portion. 3. Grade II-III chondromalacia in the lateral compartment,     predominantly grade III changes diffusely. 4. Grade III chondromalacia in the patellofemoral compartment with     unstable loose fragment. 5. Previous scarring to the anterior medial aspect of the knee.  PROCEDURE: 1. Left knee diagnostic and operative arthroscopy with medial and     lateral partial meniscectomies. 2. Medial patellofemoral and lateral compartment chondroplasty with     debridement of chondral flaps and loose fragments as well as loose     flaps of cartilage back to a stable rim. 3. Medial and anterior synovectomy, scar debridement.  SURGEON:  Madlyn Frankel. Charlann Boxer, MD  ASSISTANT:  Jaquelyn Bitter. Chabon, PA-C  ANESTHESIA:  Spinal.  DRAINS:  None.  COMPLICATION:  None.  INDICATION FOR PROCEDURE:  Ms. Betty Robinson is a 56 year old female with a left knee arthroscopy this year.  She has had progressive problems with this left knee with progressive problems of the right knee.  After lengthy discussion in the office and failed conservative measures including therapy and injections, the plan at this point was to proceed with a left knee repeat arthroscopic surgery to evaluate for potential chondrolysis as this was  identified at her initial index arthroscopic surgery.  Also rule out any other internal derangement.  It could cause persistent pain despite conservative attempts.  This was done in the setting of a scheduled right total knee replacement.  Risks of potential progression of arthritis, persistent pain, need for future surgery including arthroplasty were discussed and reviewed.  Consent was obtained.  PROCEDURE IN DETAIL:  The patient was brought to the operative theater. Once adequate anesthesia, preop antibiotics administered, she was positioned supine.  The left leg was addressed first.  The left lower extremity was then prepped and draped in sterile fashion.  Mr. Jodene Nam, PA was utilized for management of the extremity during the case given the fact that we are doing this in the setting of a right total knee replacement.  Standard inferolateral, superior medial, inferior medial portals were utilized.  Diagnostic evaluation of the knee revealed the above findings.  Initially finding the grade III chondromalacia in the patellofemoral compartment with unstable flaps.  Attention was first directed into the medial compartment through inferior medial portal.  Following some synovectomy around the anterior portal site, the 3.5 Cuda shaver was used to debride the posterior horn midbody of the meniscus.  It showed relatively stability  of the meniscus from previous surgery, but a small amount of meniscus needed to be removed in this area.  I also removed chondral flaps on the tibial plateau surface where there were grade III flaps as well as some grade IV changes noted.  The anterior cruciate ligament was noted to be intact.  Laterally the posterior horn to mid midportion of the meniscus were noted to be tearing central to half of the meniscus.  This was shaved back to a stable level.  In addition, chondroplasty carried out laterally of unstable flaps.  Following the lateral and  medial compartments, anterior medial synovectomy was carried out, debriding any potential scarring and irritation of the medial femoral condyle.  In addition, chondroplasty was carried out around the patella, undersurface of the apex in addition to the trochlea with fissuring noted.  I did not expose down to bone in this area and instead just removed some loose fragments of cartilage. The patella seemed to track normally albeit with grade III changes in this area.  At this point, final pictures were taken.  The knee was reexamined to make certain there were no loose fragments of cartilage floating in the knee.  Once I was satisfied, this was all addressed, the instrumentation was removed, portal sites were reapproximated using 4-0 nylon.  The knee was injected intraarticularly with 0.25% Marcaine with epinephrine.  The knee was then wrapped in sterile bulky Jones dressing.  She was then brought to the recovery room in stable condition, tolerating the procedure well.  This was the first part of Tuesday's procedure where her right total knee replacement was performed through a secondary sterile prep and drape.     Madlyn Frankel Charlann Boxer, M.D.     MDO/MEDQ  D:  10/15/2012  T:  10/15/2012  Job:  [id number]

## 2012-10-15 NOTE — Anesthesia Postprocedure Evaluation (Signed)
  Anesthesia Post-op Note  Patient: Betty Robinson  Procedure(s) Performed: Procedure(s) (LRB): TOTAL KNEE ARTHROPLASTY (Right) ARTHROSCOPY KNEE (Left)  Patient Location: PACU  Anesthesia Type: Spinal  Level of Consciousness: awake and alert   Airway and Oxygen Therapy: Patient Spontanous Breathing  Post-op Pain: mild  Post-op Assessment: Post-op Vital signs reviewed, Patient's Cardiovascular Status Stable, Respiratory Function Stable, Patent Airway and No signs of Nausea or vomiting  Last Vitals:  Filed Vitals:   10/15/12 1045  BP: 129/64  Pulse: 84  Temp: 36.7 C  Resp: 13    Post-op Vital Signs: stable   Complications: No apparent anesthesia complications

## 2012-10-15 NOTE — Transfer of Care (Signed)
Immediate Anesthesia Transfer of Care Note  Patient: Betty Robinson  Procedure(s) Performed: Procedure(s) (LRB) with comments: TOTAL KNEE ARTHROPLASTY (Right) ARTHROSCOPY KNEE (Left)  Patient Location: PACU  Anesthesia Type:MAC and Spinal  Level of Consciousness: awake, alert , oriented and pateint uncooperative  Airway & Oxygen Therapy: Patient Spontanous Breathing and Patient connected to face mask oxygen  Post-op Assessment: Report given to PACU RN and Post -op Vital signs reviewed and stable  Post vital signs: Reviewed and stable  Complications: No apparent anesthesia complications

## 2012-10-15 NOTE — Op Note (Signed)
NAME:  DORAMAE SCHOENFELD                      MEDICAL RECORD NO.:  [id number]                             FACILITY:  [facility]      PHYSICIAN:  Madlyn Frankel. Charlann Boxer, M.D.  DATE OF BIRTH:  1955-12-03      DATE OF PROCEDURE:  10/15/2012                                     OPERATIVE REPORT         PREOPERATIVE DIAGNOSIS:  Right knee osteoarthritis.      POSTOPERATIVE DIAGNOSIS:  Right knee osteoarthritis.      FINDINGS:  The patient was noted to have complete loss of cartilage and   bone-on-bone arthritis with associated osteophytes in the medial and patellofemoral compartments of   the knee with a significant synovitis and associated effusion.      PROCEDURE:  Right total knee replacement.      COMPONENTS USED:  DePuy rotating platform posterior stabilized knee   system, a size 2.5 femur, 2.5 tibia, 12.5 mm PS insert, and 38 patellar   button.      SURGEON:  Madlyn Frankel. Charlann Boxer, M.D.      ASSISTANT:  Leilani Able, PA-C.      ANESTHESIA:  Spinal.      SPECIMENS:  None.      COMPLICATION:  None.      DRAINS:  One Hemovac.  EBL: <150cc      TOURNIQUET TIME:   Total Tourniquet Time Documented: Thigh (Right) - 40 minutes .      The patient was stable to the recovery room.      INDICATION FOR PROCEDURE:  TYLAYAH PARKER is a 56 y.o. female patient of   mine.  The patient had been seen, evaluated, and treated conservatively in the   office with medication, activity modification, and injections.  The patient had   radiographic changes of bone-on-bone arthritis with endplate sclerosis and osteophytes noted.      The patient failed conservative measures including medication, injections, and activity modification, and at this point was ready for more definitive measures.   Based on the radiographic changes and failed conservative measures, the patient   decided to proceed with total knee replacement.  Risks of infection,   DVT, component failure, need for revision surgery, postop course, and   expectations were all   discussed and reviewed.  Consent was obtained for benefit of pain   relief.      PROCEDURE IN DETAIL:  The patient was brought to the operative theater.   Once adequate anesthesia, preoperative antibiotics, 2 gm of Ancef administered, the patient was positioned supine with the right thigh tourniquet placed.  The  right lower extremity was prepped and draped in sterile fashion.  A time-   out was performed identifying the patient, planned procedure, and   extremity.      The right lower extremity was placed in the [facility] leg holder.  The leg was   exsanguinated, tourniquet elevated to 250 mmHg.  A midline incision was   made followed by median parapatellar arthrotomy.  Following initial   exposure, attention was first directed to the patella.  Precut  measurement was noted to be 23 mm.  I resected down to 14 mm and used a   38 patellar button to restore patellar height as well as cover the cut   surface.      The lug holes were drilled and a metal shim was placed to protect the   patella from retractors and saw blades.      At this point, attention was now directed to the femur.  The femoral   canal was opened with a drill, irrigated to try to prevent fat emboli.  An   intramedullary rod was passed at 3 degrees valgus, 10 mm of bone was   resected off the distal femur.  Following this resection, the tibia was   subluxated anteriorly.  Using the extramedullary guide, 10 mm of bone was resected off   the proximal lateral tibia.  We confirmed the gap would be   stable medially and laterally with a 10 mm insert as well as confirmed   the cut was perpendicular in the coronal plane, checking with an alignment rod.      Once this was done, I sized the femur to be a size 2.5 in the anterior-   posterior dimension, chose a standard component based on medial and   lateral dimension.  The size 2.5 rotation block was then pinned in   position anterior referenced using the  C-clamp to set rotation.  The   anterior, posterior, and  chamfer cuts were made without difficulty nor   notching making certain that I was along the anterior cortex to help   with flexion gap stability. Posterior osteophytes were removed of the back of the distal femur.     The final box cut was made off the lateral aspect of distal femur.      At this point, the tibia was sized to be a size 2.5, the size 2.5 tray was   then pinned in position through the medial third of the tubercle,   drilled, and keel punched.  Trial reduction was now carried with a 2.5 femur,  2.5 tibia, a 12.5 mm insert, and the 38 patella botton.  The knee was brought to   extension, full extension with good flexion stability with the patella   tracking through the trochlea without application of pressure.  Given   all these findings, the trial components removed.  Final components were   opened and cement was mixed.  The knee was irrigated with normal saline   solution and pulse lavage.  The synovial lining was   then injected with 0.25% Marcaine with epinephrine and 1 cc of Toradol,   total of 61 cc.      The knee was irrigated.  Final implants were then cemented onto clean and   dried cut surfaces of bone with the knee brought to extension with a 12.5 mm trial insert.      Once the cement had fully cured, the excess cement was removed   throughout the knee.  I confirmed I was satisfied with the range of   motion and stability, and the final 12.5 mm PS insert was chosen.  It was   placed into the knee.      The tourniquet had been let down at 39 minutes.  No significant   hemostasis required.  The medium Hemovac drain was placed deep.  The   extensor mechanism was then reapproximated using #1 Vicryl with the knee   in flexion.  The  remaining wound was closed with 2-0 Vicryl and running 4-0 Monocryl.   The knee was cleaned, dried, dressed sterilely using Dermabond and   Aquacel dressing.  Drain site  dressed separately.  The patient was then   brought to recovery room in stable condition, tolerating the procedure   well.   Please note that Physician Assistant, Leilani Able, PA-C, was present for the entirety of the case, and was utilized for pre-operative positioning, peri-operative retractor management, general facilitation of the procedure.  He was also utilized for primary wound closure at the end of the case.              Madlyn Frankel Charlann Boxer, M.D.

## 2012-10-16 ENCOUNTER — Encounter (HOSPITAL_COMMUNITY): Payer: Self-pay | Admitting: Orthopedic Surgery

## 2012-10-16 DIAGNOSIS — E669 Obesity, unspecified: Secondary | ICD-10-CM | POA: Diagnosis present

## 2012-10-16 DIAGNOSIS — D500 Iron deficiency anemia secondary to blood loss (chronic): Secondary | ICD-10-CM | POA: Diagnosis not present

## 2012-10-16 LAB — CBC
HCT: 31.6 % — ABNORMAL LOW (ref 36.0–46.0)
Hemoglobin: 10.6 g/dL — ABNORMAL LOW (ref 12.0–15.0)
MCH: 31.4 pg (ref 26.0–34.0)
MCHC: 33.5 g/dL (ref 30.0–36.0)

## 2012-10-16 LAB — BASIC METABOLIC PANEL
BUN: 11 mg/dL (ref 6–23)
Calcium: 8.6 mg/dL (ref 8.4–10.5)
GFR calc non Af Amer: >90 mL/min (ref >90)
Glucose, Bld: 136 mg/dL — ABNORMAL HIGH (ref 70–99)
Potassium: 4.5 mEq/L (ref 3.5–5.1)

## 2012-10-16 MED ORDER — PROMETHAZINE HCL 25 MG/ML IJ SOLN
12.5000 mg | Freq: Four times a day (QID) | INTRAMUSCULAR | Status: DC | PRN
  Administered 2012-10-16: 12.5 mg via INTRAVENOUS
  Filled 2012-10-16: qty 1

## 2012-10-16 MED ORDER — FERROUS SULFATE 325 (65 FE) MG PO TABS: 325.0000 mg | ORAL_TABLET | Freq: Three times a day (TID) | ORAL | Status: DC

## 2012-10-16 MED ORDER — POLYETHYLENE GLYCOL 3350 17 G PO PACK: 17.0000 g | PACK | Freq: Every day | ORAL | Status: DC | PRN

## 2012-10-16 MED ORDER — METHOCARBAMOL 500 MG PO TABS: 500.0000 mg | ORAL_TABLET | Freq: Four times a day (QID) | ORAL | Status: DC | PRN

## 2012-10-16 MED ORDER — CELECOXIB 200 MG PO CAPS: 200.0000 mg | ORAL_CAPSULE | Freq: Two times a day (BID) | ORAL | Status: DC

## 2012-10-16 MED ORDER — DSS 100 MG PO CAPS: 100.0000 mg | ORAL_CAPSULE | Freq: Two times a day (BID) | ORAL | Status: DC

## 2012-10-16 MED ORDER — HYDROCODONE-ACETAMINOPHEN 7.5-325 MG PO TABS: 1.0000 | ORAL_TABLET | ORAL | Status: DC | PRN

## 2012-10-16 MED ORDER — ASPIRIN EC 325 MG PO TBEC: 325.0000 mg | DELAYED_RELEASE_TABLET | Freq: Two times a day (BID) | ORAL | Status: DC

## 2012-10-16 MED ORDER — PROMETHAZINE HCL 12.5 MG PO TABS: 12.5000 mg | ORAL_TABLET | Freq: Four times a day (QID) | ORAL | Status: DC | PRN

## 2012-10-16 NOTE — Evaluation (Signed)
Physical Therapy Evaluation Patient Details Name: Betty Robinson MRN: [id number] DOB: January 30, 1956 Today's Date: 10/16/2012 Time: 4098-1191 PT Time Calculation (min): 29 min  PT Assessment / Plan / Recommendation Clinical Impression  Pt s/p R TKR and L knee scope presents with decreased Bil LE strength/ROM (R more affected), and pain limiting functional mobility.    PT Assessment  Patient needs continued PT services    Follow Up Recommendations  Home health PT    Does the patient have the potential to tolerate intense rehabilitation      Barriers to Discharge None      Equipment Recommendations  None recommended by PT    Recommendations for Other Services OT consult   Frequency 7X/week    Precautions / Restrictions Precautions Precautions: Knee Required Braces or Orthoses: Knee Immobilizer - Right Knee Immobilizer - Right: Discontinue once straight leg raise with < 10 degree lag Restrictions Weight Bearing Restrictions: No Other Position/Activity Restrictions: WBAT   Pertinent Vitals/Pain 5/10; premedicated, cold packs provided      Mobility  Bed Mobility Bed Mobility: Supine to Sit Supine to Sit: 3: Mod assist Details for Bed Mobility Assistance: cues for sequence and for use of L LE and UEs to self assist Transfers Transfers: Sit to Stand;Stand to Sit Sit to Stand: 1: +2 Total assist Sit to Stand: Patient Percentage: 70% Stand to Sit: 1: +2 Total assist Stand to Sit: Patient Percentage: 70% Details for Transfer Assistance: cues for use of UEs and for LE management Ambulation/Gait Ambulation/Gait Assistance: 1: +2 Total assist Ambulation/Gait: Patient Percentage: 70% Ambulation Distance (Feet): 6 Feet Assistive device: Rolling walker Ambulation/Gait Assistance Details: cues for posture, sequence, position from RW and stride length Gait Pattern: Step-to pattern    Shoulder Instructions     Exercises Total Joint Exercises Ankle Circles/Pumps: AROM;10  reps;Both;Supine Quad Sets: AROM;Both;10 reps;Supine Heel Slides: AAROM;10 reps;Supine;Both Straight Leg Raises: AROM;AAROM;10 reps;Both;Supine (AROM on L)   PT Diagnosis: Difficulty walking  PT Problem List: Decreased range of motion;Decreased strength;Decreased activity tolerance;Decreased mobility;Decreased knowledge of use of DME;Pain;Decreased knowledge of precautions PT Treatment Interventions: DME instruction;Gait training;Stair training;Functional mobility training;Therapeutic activities;Therapeutic exercise;Patient/family education   PT Goals Acute Rehab PT Goals PT Goal Formulation: With patient Time For Goal Achievement: 10/19/12 Potential to Achieve Goals: Good Pt will go Supine/Side to Sit: with supervision PT Goal: Supine/Side to Sit - Progress: Goal set today Pt will go Sit to Supine/Side: with supervision PT Goal: Sit to Supine/Side - Progress: Goal set today Pt will go Sit to Stand: with supervision PT Goal: Sit to Stand - Progress: Goal set today Pt will go Stand to Sit: with supervision PT Goal: Stand to Sit - Progress: Goal set today Pt will Ambulate: 51 - 150 feet;with supervision;with rolling walker PT Goal: Ambulate - Progress: Goal set today Pt will Go Up / Down Stairs: 3-5 stairs;with min assist;with least restrictive assistive device PT Goal: Up/Down Stairs - Progress: Goal set today  Visit Information  Last PT Received On: 10/16/12 Assistance Needed: +2    Subjective Data  Subjective: I'm just drunk on the medication Patient Stated Goal: Resume previous lifestyle with decreased pain   Prior Functioning  Home Living Lives With: Spouse Available Help at Discharge: Family Type of Home: House Home Access: Stairs to enter Secretary/administrator of Steps: 4 Entrance Stairs-Rails: Right;Left Home Layout: One level Home Adaptive Equipment: Walker - rolling Prior Function Level of Independence: Independent;Independent with assistive device(s) Able to  Take Stairs?: Yes Driving: Yes Communication Communication: No  difficulties    Cognition  Overall Cognitive Status: Appears within functional limits for tasks assessed/performed Arousal/Alertness: Awake/alert Orientation Level: Appears intact for tasks assessed Behavior During Session: [facility] for tasks performed    Extremity/Trunk Assessment Right Upper Extremity Assessment RUE ROM/Strength/Tone: [facility] for tasks assessed Left Upper Extremity Assessment LUE ROM/Strength/Tone: WFL for tasks assessed Right Lower Extremity Assessment RLE ROM/Strength/Tone: Deficits RLE ROM/Strength/Tone Deficits: 2+/5 quads with AAROM at knee -10 - 45 Left Lower Extremity Assessment LLE ROM/Strength/Tone: Deficits LLE ROM/Strength/Tone Deficits: min of 3/5 quads with aarom -5 - 90 at knee   Balance    End of Session PT - End of Session Equipment Utilized During Treatment: Gait belt;Right knee immobilizer Activity Tolerance: Patient tolerated treatment well Patient left: in chair;with call bell/phone within reach;with family/visitor present Nurse Communication: Mobility status  GP     Kaci Freel 10/16/2012, 12:26 PM

## 2012-10-16 NOTE — Progress Notes (Signed)
   Subjective: 1 Day Post-Op Procedure(s) (LRB): TOTAL KNEE ARTHROPLASTY (Right) ARTHROSCOPY KNEE (Left)   Patient reports pain as mild, pain well controlled. However she has been complaining of a lot of nausea and some vomiting. She associates it with the Dilaudid use, but probably some residual anesthesia N&V. Otherwise no events.  Objective:   VITALS:   Filed Vitals:   10/16/12  BP: 115/75  Pulse: 64  Temp: 97.5 F (36.4 C)   Resp: 16    Neurovascular intact Dorsiflexion/Plantar flexion intact Incision: dressing C/D/I No cellulitis present Compartment soft  LABS  Basename 10/16/12 0442  HGB 10.6*  HCT 31.6*  WBC 12.4*  PLT 292     Basename 10/16/12 0442  NA 137  K 4.5  BUN 11  CREATININE 0.61  GLUCOSE 136*     Assessment/Plan: 1 Day Post-Op Procedure(s) (LRB): TOTAL KNEE ARTHROPLASTY (Right) ARTHROSCOPY KNEE (Left) HV drain d/c'ed Foley cath d/c'ed Added phenergan Maintain IV fluids to maintain her hydration status. Advance diet Up with therapy D/C IV fluids Plan for discharge tomorrow to home if she feels better   Expected ABLA  Treated with iron and will observe  Obese (BMI 30-39.9)  Estimated Body mass index is 39.68 kg/(m^2) as calculated from the following:   Height as of this encounter: 5\' 1" (1.549 m).   Weight as of this encounter: 210 lb(95.255 kg). Patient also counseled that weight may inhibit the healing process Patient counseled that losing weight will help with future health issues      Anastasio Auerbach. Bolton Canupp   PAC  10/16/2012, 9:09 AM

## 2012-10-16 NOTE — Progress Notes (Signed)
CARE MANAGEMENT NOTE 10/16/2012  Patient:  Betty Robinson, Betty Robinson   Account Number:  [id number]  Date Initiated:  10/16/2012  Documentation initiated by:  Colleen Can  Subjective/Objective Assessment:   dx total right knee replacemnt     Action/Plan:   Cm spoke with patient. Plans are for her to return to her home in [place], [place] where her spouse, sister-in-law and friend will be caregivers. She is requesting [facility] for hh services. She already has RW, cane and bsc commode   Anticipated DC Date:  10/18/2012   Anticipated DC Plan:  HOME W HOME HEALTH SERVICES  In-house referral  NA      DC Planning Services  CM consult      [facility] Choice  HOME HEALTH   Choice offered to / List presented to:  C-1 Patient   DME arranged  NA      DME agency  NA        Status of service:  In process, will continue to follow

## 2012-10-16 NOTE — Progress Notes (Signed)
Physical Therapy Treatment Patient Details Name: Betty Robinson MRN: [id number] DOB: June 09, 1956 Today's Date: 10/16/2012 Time: 4098-1191 PT Time Calculation (min): 25 min  PT Assessment / Plan / Recommendation Comments on Treatment Session       Follow Up Recommendations  Home health PT     Does the patient have the potential to tolerate intense rehabilitation     Barriers to Discharge        Equipment Recommendations  None recommended by PT    Recommendations for Other Services OT consult  Frequency 7X/week   Plan Discharge plan remains appropriate    Precautions / Restrictions Precautions Precautions: Knee Required Braces or Orthoses: Knee Immobilizer - Right Knee Immobilizer - Right: Discontinue once straight leg raise with < 10 degree lag Restrictions Weight Bearing Restrictions: No Other Position/Activity Restrictions: WBAT   Pertinent Vitals/Pain 6/10; premedicated, ice packs provided, RN aware    Mobility  Bed Mobility Bed Mobility: Sit to Supine Sit to Supine: 4: Min assist Details for Bed Mobility Assistance: cues for sequence and for use of L LE and UEs to self assist Transfers Transfers: Sit to Stand;Stand to Sit Sit to Stand: 4: Min assist Stand to Sit: 4: Min assist;3: Mod assist Details for Transfer Assistance: cues for use of UEs and for LE management Ambulation/Gait Ambulation/Gait Assistance: 4: Min assist Ambulation Distance (Feet): 72 Feet Assistive device: Rolling walker Ambulation/Gait Assistance Details: cues for sequence, posture, and position from RW Gait Pattern: Step-to pattern    Exercises     PT Diagnosis:    PT Problem List:   PT Treatment Interventions:     PT Goals Acute Rehab PT Goals PT Goal Formulation: With patient Time For Goal Achievement: 10/19/12 Potential to Achieve Goals: Good Pt will go Supine/Side to Sit: with supervision PT Goal: Supine/Side to Sit - Progress: Progressing toward goal Pt will go Sit to  Supine/Side: with supervision PT Goal: Sit to Supine/Side - Progress: Progressing toward goal Pt will go Sit to Stand: with supervision PT Goal: Sit to Stand - Progress: Progressing toward goal Pt will go Stand to Sit: with supervision PT Goal: Stand to Sit - Progress: Progressing toward goal Pt will Ambulate: 51 - 150 feet;with supervision;with rolling walker PT Goal: Ambulate - Progress: Progressing toward goal Pt will Go Up / Down Stairs: 3-5 stairs;with min assist;with least restrictive assistive device PT Goal: Up/Down Stairs - Progress: Goal set today  Visit Information  Last PT Received On: 10/16/12 Assistance Needed: +1    Subjective Data  Subjective: I think this IV site hurts as much as my knee when ever I lean on the walker Patient Stated Goal: Resume previous lifestyle with decreased pain   Cognition  Overall Cognitive Status: Appears within functional limits for tasks assessed/performed Arousal/Alertness: Awake/alert Orientation Level: Appears intact for tasks assessed Behavior During Session: [facility] for tasks performed    Balance     End of Session PT - End of Session Equipment Utilized During Treatment: Gait belt;Right knee immobilizer Activity Tolerance: Patient tolerated treatment well Patient left: in bed;with call bell/phone within reach Nurse Communication: Mobility status   GP     Jessamy Torosyan 10/16/2012, 4:48 PM

## 2012-10-16 NOTE — Progress Notes (Signed)
OT Cancellation Note  Patient Details Name: Betty Robinson MRN: [id number] DOB: August 09, 1956   Cancelled Treatment:    Reason Eval/Treat Not Completed: Other (comment) (nausea)  Will check later today or tomorrow.  Nai Borromeo 10/16/2012, 12:12 PM Marica Otter, OTR/L [phone] 10/16/2012

## 2012-10-17 LAB — CBC
HCT: 30.3 % — ABNORMAL LOW (ref 36.0–46.0)
Platelets: 279 10*3/uL (ref 150–400)
RDW: 14.5 % (ref 11.5–15.5)
WBC: 10.4 10*3/uL (ref 4.0–10.5)

## 2012-10-17 LAB — BASIC METABOLIC PANEL
Chloride: 102 mEq/L (ref 96–112)
GFR calc Af Amer: >90 mL/min (ref >90)
GFR calc non Af Amer: >90 mL/min (ref >90)
Potassium: 4 mEq/L (ref 3.5–5.1)
Sodium: 136 mEq/L (ref 135–145)

## 2012-10-17 NOTE — Evaluation (Signed)
Occupational Therapy Evaluation Patient Details Name: IRLENE CRUDUP MRN: [id number] DOB: [birth year] Today's Date: 10/17/2012 Time: 4098-1191 OT Time Calculation (min): 13 min  OT Assessment / Plan / Recommendation Clinical Impression  Pt is s/p R TKA and arthroscopy of L knee and is doing well despite feeling nauseous. Planning d/c today and husband can help at discharge. No follow up needed.     OT Assessment  Patient does not need any further OT services (pt d/c today and has assist PRN.)    Follow Up Recommendations  No OT follow up;Supervision/Assistance - 24 hour    Barriers to Discharge      Equipment Recommendations  None recommended by PT;None recommended by OT    Recommendations for Other Services    Frequency       Precautions / Restrictions Precautions Precautions: Knee Required Braces or Orthoses: Knee Immobilizer - Right Knee Immobilizer - Right: Discontinue once straight leg raise with < 10 degree lag Restrictions Weight Bearing Restrictions: No Other Position/Activity Restrictions: WBAT        ADL  Eating/Feeding: Simulated;Independent Where Assessed - Eating/Feeding: Chair Grooming: Simulated;Wash/dry hands;Set up Where Assessed - Grooming: Supported sitting Upper Body Bathing: Simulated;Chest;Right arm;Left arm;Abdomen;Set up Where Assessed - Upper Body Bathing: Unsupported sitting Lower Body Bathing: Simulated;Moderate assistance Where Assessed - Lower Body Bathing: Supported sit to stand Upper Body Dressing: Simulated;Set up Where Assessed - Upper Body Dressing: Unsupported sitting Lower Body Dressing: Simulated;Moderate assistance Where Assessed - Lower Body Dressing: Supported sit to stand Toilet Transfer: Counsellor Method: Sit to stand Toileting - Architect and Hygiene: Simulated;Minimal assistance Where Assessed - Engineer, mining and Hygiene: Sit to stand from 3-in-1 or toilet Tub/Shower  Transfer Method: Not assessed Equipment Used: Rolling walker ADL Comments: Husband present. Pt states chair is high and gowns in the way so therapy assist pt to start pants over feet and then pt stood to pull up. Husband can assist with starting clothing over feet at home. Pt did well standing to pull up pants with min assist for light balance support. Reviewed how to don/doff KI and to wear until able to SLR. Pt declined need to practice 3in1 transfer, stating she had been up to 3in1 several times. She continues to feel nauseous but able to participate. Nursing aware. Demonstrated shower transfer technique and pt verbalized understanding. She states she doesnt feel she needs to practice this either.     OT Diagnosis:    OT Problem List:   OT Treatment Interventions:     OT Goals    Visit Information  Last OT Received On: 10/17/12 Assistance Needed: +1    Subjective Data  Subjective: I am about ready to go home Patient Stated Goal: home   Prior Functioning     Home Living Lives With: Spouse Available Help at Discharge: Family Type of Home: House Home Access: Stairs to enter Secretary/administrator of Steps: 4 Entrance Stairs-Rails: Right;Left Home Layout: One level Bathroom Shower/Tub: Health visitor: Standard Home Adaptive Equipment: Walker - rolling;Bedside commode/3-in-1 Prior Function Level of Independence: Independent;Independent with assistive device(s) Able to Take Stairs?: Yes Driving: Yes Communication Communication: No difficulties         Vision/Perception     Cognition  Overall Cognitive Status: Appears within functional limits for tasks assessed/performed Arousal/Alertness: Awake/alert Orientation Level: Appears intact for tasks assessed Behavior During Session: [facility] for tasks performed    Extremity/Trunk Assessment Right Upper Extremity Assessment RUE ROM/Strength/Tone: [facility] for tasks assessed Left  Upper Extremity Assessment LUE  ROM/Strength/Tone: WFL for tasks assessed     Mobility Transfers Transfers: Sit to Stand;Stand to Sit Sit to Stand: 4: Min guard;With upper extremity assist;From chair/3-in-1 Stand to Sit: 4: Min guard;With upper extremity assist;To chair/3-in-1 Details for Transfer Assistance: min verbal cues for hand placement and LE management.     Shoulder Instructions     Exercise Total Joint Exercises Ankle Circles/Pumps: AROM;Both;Supine;20 reps Quad Sets: AROM;Both;Supine;15 reps Heel Slides: AAROM;Supine;Both;15 reps Straight Leg Raises: AROM;AAROM;Both;Supine;15 reps   Balance Balance Balance Assessed: Yes Dynamic Standing Balance Dynamic Standing - Level of Assistance: 4: Min assist   End of Session OT - End of Session Activity Tolerance: Other (comment) (pt with nausea but able to participate) Patient left: with call bell/phone within reach;in chair;with family/visitor present  GO     Lennox Laity [phone] 10/17/2012, 11:57 AM

## 2012-10-17 NOTE — Progress Notes (Signed)
Physical Therapy Treatment Patient Details Name: Betty Robinson MRN: [id number] DOB: 1956/02/01 Today's Date: 10/17/2012 Time: 0930-1000 PT Time Calculation (min): 30 min  PT Assessment / Plan / Recommendation Comments on Treatment Session  Amb/stairs deferred to arrival of spouse    Follow Up Recommendations  Home health PT     Does the patient have the potential to tolerate intense rehabilitation     Barriers to Discharge        Equipment Recommendations  None recommended by PT    Recommendations for Other Services OT consult  Frequency 7X/week   Plan Discharge plan remains appropriate    Precautions / Restrictions Precautions Precautions: Knee Required Braces or Orthoses: Knee Immobilizer - Right Knee Immobilizer - Right: Discontinue once straight leg raise with < 10 degree lag Restrictions Weight Bearing Restrictions: No Other Position/Activity Restrictions: WBAT   Pertinent Vitals/Pain     Mobility  Transfers Transfers: Sit to Stand;Stand to Sit Sit to Stand: 4: Min guard Stand to Sit: 4: Min guard Details for Transfer Assistance: cues for use of UEs and for LE management Ambulation/Gait Ambulation/Gait Assistance: 4: Min guard;5: Supervision Ambulation Distance (Feet): 75 Feet Assistive device: Rolling walker Ambulation/Gait Assistance Details: min cues for sequence, posture, position from RW and stride length Gait Pattern: Step-to pattern Stairs: Yes Stairs Assistance: 4: Min assist Stairs Assistance Details (indicate cue type and reason): cues for sequence and foot/crutch placement Stair Management Technique: One rail Right;With crutches;Forwards Number of Stairs: 4     Exercises Total Joint Exercises Ankle Circles/Pumps: AROM;Both;Supine;20 reps Quad Sets: AROM;Both;Supine;15 reps Heel Slides: AAROM;Supine;Both;15 reps Straight Leg Raises: AROM;AAROM;Both;Supine;15 reps   PT Diagnosis:    PT Problem List:   PT Treatment Interventions:     PT  Goals Acute Rehab PT Goals PT Goal Formulation: With patient Time For Goal Achievement: 10/19/12 Potential to Achieve Goals: Good Pt will go Supine/Side to Sit: with supervision PT Goal: Supine/Side to Sit - Progress: Progressing toward goal Pt will go Sit to Supine/Side: with supervision PT Goal: Sit to Supine/Side - Progress: Progressing toward goal Pt will go Sit to Stand: with supervision PT Goal: Sit to Stand - Progress: Progressing toward goal Pt will go Stand to Sit: with supervision PT Goal: Stand to Sit - Progress: Progressing toward goal Pt will Ambulate: 51 - 150 feet;with supervision;with rolling walker PT Goal: Ambulate - Progress: Progressing toward goal Pt will Go Up / Down Stairs: 3-5 stairs;with min assist;with least restrictive assistive device PT Goal: Up/Down Stairs - Progress: Progressing toward goal  Visit Information  Last PT Received On: 10/17/12 Assistance Needed: +1    Subjective Data  Subjective: I'm going home today Patient Stated Goal: Resume previous lifestyle with decreased pain   Cognition  Overall Cognitive Status: Appears within functional limits for tasks assessed/performed Arousal/Alertness: Awake/alert Orientation Level: Appears intact for tasks assessed Behavior During Session: [facility] for tasks performed    Balance     End of Session PT - End of Session Equipment Utilized During Treatment: Gait belt;Right knee immobilizer Activity Tolerance: Patient limited by pain;Patient tolerated treatment well Patient left: in chair;with call bell/phone within reach Nurse Communication: Mobility status   GP     Betty Robinson 10/17/2012, 11:42 AM

## 2012-10-17 NOTE — Progress Notes (Signed)
Physical Therapy Treatment Patient Details Name: Betty Robinson MRN: [id number] DOB: [birth year] Today's Date: 10/17/2012 Time: 7829-5621 PT Time Calculation (min): 23 min  PT Assessment / Plan / Recommendation Comments on Treatment Session  Amb/stairs deferred to arrival of spouse    Follow Up Recommendations  Home health PT     Does the patient have the potential to tolerate intense rehabilitation     Barriers to Discharge        Equipment Recommendations  None recommended by PT    Recommendations for Other Services OT consult  Frequency 7X/week   Plan Discharge plan remains appropriate    Precautions / Restrictions Precautions Precautions: Knee Required Braces or Orthoses: Knee Immobilizer - Right Knee Immobilizer - Right: Discontinue once straight leg raise with < 10 degree lag Restrictions Weight Bearing Restrictions: No Other Position/Activity Restrictions: WBAT   Pertinent Vitals/Pain 6-7/10 with there ex R knee; premedicated, cold packs provided    Mobility       Exercises Total Joint Exercises Ankle Circles/Pumps: AROM;Both;Supine;20 reps Quad Sets: AROM;Both;Supine;15 reps Heel Slides: AAROM;Supine;Both;15 reps Straight Leg Raises: AROM;AAROM;Both;Supine;15 reps   PT Diagnosis:    PT Problem List:   PT Treatment Interventions:     PT Goals Acute Rehab PT Goals PT Goal Formulation: With patient Time For Goal Achievement: 10/19/12 Potential to Achieve Goals: Good  Visit Information  Last PT Received On: 10/17/12 Assistance Needed: +1    Subjective Data  Subjective: I'm going home today Patient Stated Goal: Resume previous lifestyle with decreased pain   Cognition  Overall Cognitive Status: Appears within functional limits for tasks assessed/performed Arousal/Alertness: Awake/alert Orientation Level: Appears intact for tasks assessed Behavior During Session: [facility] for tasks performed    Balance     End of Session PT - End of Session Activity  Tolerance: Patient limited by pain;Patient tolerated treatment well Patient left: in chair;with call bell/phone within reach   GP     [facility] 10/17/2012, 11:36 AM

## 2012-10-17 NOTE — Progress Notes (Signed)
Patient ID: Betty Robinson, female   DOB: October 04, 1956, 56 y.o.   MRN: [id number] Subjective: 2 Days Post-Op Procedure(s) (LRB): TOTAL KNEE ARTHROPLASTY (Right) ARTHROSCOPY KNEE (Left)    Patient reports pain as moderate.  Has had some nausea with dilaudid, coming along fairly well  Objective:   VITALS:   Filed Vitals:   10/17/12 0435  BP: 95/59  Pulse: 83  Temp: 97.9 F (36.6 C)  Resp: 16    Neurovascular intact Incision: dressing C/D/I bilaterally, right total knee, left knee scope  LABS  Basename 10/17/12 0454 10/16/12 0442  HGB 9.8* 10.6*  HCT 30.3* 31.6*  WBC 10.4 12.4*  PLT 279 292     Basename 10/17/12 0454 10/16/12 0442  NA 136 137  K 4.0 4.5  BUN 11 11  CREATININE 0.65 0.61  GLUCOSE 113* 136*    No results found for this basename: LABPT:2,INR:2 in the last 72 hours   Assessment/Plan: 2 Days Post-Op Procedure(s) (LRB): TOTAL KNEE ARTHROPLASTY (Right) ARTHROSCOPY KNEE (Left)   Up with therapy Discharge home with home health Follow up in 2 weeks in office

## 2012-10-18 NOTE — Care Management Note (Signed)
    Page 1 of 2   10/18/2012     11:16:31 AM   CARE MANAGEMENT NOTE 10/18/2012  Patient:  Betty Robinson, Betty Robinson   Account Number:  [id number]  Date Initiated:  10/16/2012  Documentation initiated by:  Colleen Can  Subjective/Objective Assessment:   dx total right knee replacemnt     Action/Plan:   Cm spoke with patient. Plans are for her to return to her home in [place], [place] where her spouse, sister-in-law and friend will be caregivers. She is requesting [facility] for hh services. She already has RW, cane and bsc commode   Anticipated DC Date:  10/18/2012   Anticipated DC Plan:  HOME W HOME HEALTH SERVICES  In-house referral  NA      DC Planning Services  CM consult      [facility] Choice  HOME HEALTH   Choice offered to / List presented to:  C-1 Patient   DME arranged  NA      DME agency  NA     HH arranged  HH-2 PT      HH agency  [facility]   Status of service:  Completed, signed off Medicare Important Message given?   (If response is "NO", the following Medicare IM given date fields will be blank) Date Medicare IM given:   Date Additional Medicare IM given:    Discharge Disposition:  HOME W HOME HEALTH SERVICES  Per UR Regulation:  Reviewed for med. necessity/level of care/duration of stay  If discussed at Long Length of Stay Meetings, dates discussed:    Comments:

## 2012-10-23 NOTE — Discharge Summary (Signed)
Physician Discharge Summary  Patient ID: MARIACLARA SPEAR MRN: [id number] DOB/AGE: 1956/10/23 56 y.o.  Admit date: 10/15/2012 Discharge date: 10/17/2012   Procedures:  Procedure(s) (LRB): TOTAL KNEE ARTHROPLASTY (Right) ARTHROSCOPY KNEE (Left)  Attending Physician:  Dr. Durene Romans   Admission Diagnoses:   Bilaterally knee OA / pain  Discharge Diagnoses:  Principal Problem:  *S/P right TKA Active Problems:  S/P left knee arthroscopy  Expected blood loss anemia  Obese Hypertension   Arthritis    HPI: Betty Robinson, 56 y.o. female, has a history of pain and functional disability in the right knees due to arthritis and has failed non-surgical conservative treatments for greater than 12 weeks to includeNSAID's and/or analgesics, corticosteriod injections, viscosupplementation injections and activity modification. Onset of symptoms was gradual, starting 5 years ago with gradually worsening course since that time. The patient noted no past surgery on the right knee(s). Patient currently rates pain in the right knee(s) at 8 out of 10 with activity. Patient has worsening of pain with activity and weight bearing, pain that interferes with activities of daily living, crepitus and joint swelling. Patient has evidence of periarticular osteophytes and joint space narrowing by imaging studies. There is no active infection. Also complaining of left knee pain for about 1 year. Had a previous arthroscopy to clean up the left knee. Studies reveal preserved joint space within the left knee. Previous arthroscopy revealed chondral injuries and possible worsening of the disease is causing the more recent pain. She wishes to proceed with a right total knee arthroplasty and left knee arthroscopy. Risks, benefits and expectations were discussed with the patient. Patient understand the risks, benefits and expectations and wishes to proceed with surgery.  PCP: Gaye Alken, MD   Discharged Condition:  good  Hospital Course:  Patient underwent the above stated procedure on 10/15/2012. Patient tolerated the procedure well and brought to the recovery room in good condition and subsequently to the floor.  POD #1 BP: 115/75 ; Pulse: 64 ; Temp: 97.5 F (36.4 C) ; Resp: 16  Pt's foley was removed, as well as the hemovac drain removed. IV was changed to a saline lock. Patient reports pain as mild, pain well controlled. However she has been complaining of a lot of nausea and some vomiting. She associates it with the Dilaudid use, but probably some residual anesthesia N&V. Otherwise no events. Neurovascular intact, dorsiflexion/plantar flexion intact, incision: dressing C/D/I, no cellulitis present and compartment soft.   LABS  Basename  10/16/12 0442   HGB  10.6  HCT  31.6   POD #2  BP: 95/59 ; Pulse: 83 ; Temp: 97.9 F (36.6 C) ; Resp: 16  Patient reports pain as moderate. Has had some nausea with dilaudid, coming along fairly well. Ready to be discharged home. Neurovascular intact, dorsiflexion/plantar flexion intact, incision: dressing C/D/I, no cellulitis present and compartment soft.   LABS  Basename  10/17/12 0454   HGB  9.8  HCT  30.3    Discharge Exam: General appearance: alert, cooperative and no distress Extremities: Homans sign is negative, no sign of DVT, no edema, redness or tenderness in the calves or thighs and no ulcers, gangrene or trophic changes  Disposition:  Home or Self Care with follow up in 2 weeks   Follow-up Information    Follow up with [name], MD. Schedule an appointment as soon as possible for a visit in 2 weeks.   Contact information:   [address] [place] [address]  [phone]          Discharge Orders    Future Orders Please Complete By Expires   Diet - low sodium heart healthy      Call MD / Call 911      Comments:   If you experience chest pain or shortness of breath, CALL 911 and be transported to the hospital  emergency room.  If you develope a fever above 101 F, pus (white drainage) or increased drainage or redness at the wound, or calf pain, call your surgeon's office.   Discharge instructions      Comments:   Maintain surgical dressing for 10-14 days, then replace with gauze and tape. Keep the area dry and clean until follow up. Follow up in 2 weeks at [facility]. Call with any questions or concerns.   Constipation Prevention      Comments:   Drink plenty of fluids.  Prune juice may be helpful.  You may use a stool softener, such as Colace (over the counter) 100 mg twice a day.  Use MiraLax (over the counter) for constipation as needed.   Increase activity slowly as tolerated      Driving restrictions      Comments:   No driving for 4 weeks   TED hose      Comments:   Use stockings (TED hose) for 2 weeks on both leg(s).  You may remove them at night for sleeping.   Change dressing      Comments:   Maintain surgical dressing for 10-14 days, then change the dressing daily with sterile 4 x 4 inch gauze dressing and tape. Keep the area dry and clean.      Discharge Medication List as of 10/17/2012 10:05 AM    START taking these medications   Details  aspirin EC 325 MG tablet Take 1 tablet (325 mg total) by mouth 2 (two) times daily. X 4 weeks, Starting 10/16/2012, Until Discontinued, No Print    docusate sodium 100 MG CAPS Take 100 mg by mouth 2 (two) times daily., Starting 10/16/2012, Until Discontinued, No Print    ferrous sulfate 325 (65 FE) MG tablet Take 1 tablet (325 mg total) by mouth 3 (three) times daily after meals., Starting 10/16/2012, Until Discontinued, No Print    HYDROcodone-acetaminophen (NORCO) 7.5-325 MG per tablet Take 1-2 tablets by mouth every 4 (four) hours as needed for pain., Starting 10/16/2012, Until Discontinued, Print    methocarbamol (ROBAXIN) 500 MG tablet Take 1 tablet (500 mg total) by mouth every 6 (six) hours as needed (muscle spasms).,  Starting 10/16/2012, Until Discontinued, No Print    polyethylene glycol (MIRALAX / GLYCOLAX) packet Take 17 g by mouth daily as needed., Starting 10/16/2012, Until Discontinued, No Print    promethazine (PHENERGAN) 12.5 MG tablet Take 1 tablet (12.5 mg total) by mouth every 6 (six) hours as needed for nausea., Starting 10/16/2012, Until Discontinued, Print      CONTINUE these medications which have CHANGED   Details  celecoxib (CELEBREX) 200 MG capsule Take 1 capsule (200 mg total) by mouth 2 (two) times daily., Starting 10/16/2012, Until Discontinued, No Print      CONTINUE these medications which have NOT CHANGED   Details  amLODipine (NORVASC) 5 MG tablet Take 5 mg by mouth every evening., Until Discontinued, Historical Med    calcium carbonate (OS-CAL) 600 MG TABS Take 1,200 mg by mouth 2 (two) times daily with a meal., Until Discontinued, Historical Med    fish  oil-omega-3 fatty acids 1000 MG capsule Take 1 g by mouth daily., Until Discontinued, Historical Med    Multiple Vitamin (MULTIVITAMIN WITH MINERALS) TABS Take 1 tablet by mouth daily., Until Discontinued, Historical Med    quinapril-hydrochlorothiazide (ACCURETIC) 10-12.5 MG per tablet Take 1 tablet by mouth daily before breakfast., Until Discontinued, Historical Med      STOP taking these medications     naproxen sodium (ANAPROX) 220 MG tablet Comments:  Reason for Stopping:           Signed: Anastasio Auerbach. Kalif Kattner   PAC  10/23/2012, 8:22 PM

## 2016-06-01 NOTE — H&P (Signed)
TOTAL KNEE ADMISSION H&P  Patient is being admitted for left total knee arthroplasty.  Subjective:  Chief Complaint:   Left knee primary OA /pain  HPI: Betty Robinson, 60 y.o. female, has a history of pain and functional disability in the left knee due to arthritis and has failed non-surgical conservative treatments for greater than 12 weeks to include NSAID's and/or analgesics, corticosteriod injections and activity modification.  Onset of symptoms was gradual, starting 3+ years ago with gradually worsening course since that time. The patient noted prior procedures on the knee to include  arthroscopy on the left knee(s).  Patient currently rates pain in the left knee(s) at 9 out of 10 with activity. Patient has night pain, worsening of pain with activity and weight bearing, pain that interferes with activities of daily living, pain with passive range of motion, crepitus and joint swelling.  Patient has evidence of periarticular osteophytes and joint space narrowing by imaging studies.  There is no active infection.   Risks, benefits and expectations were discussed with the patient.  Risks including but not limited to the risk of anesthesia, blood clots, nerve damage, blood vessel damage, failure of the prosthesis, infection and up to and including death.  Patient understand the risks, benefits and expectations and wishes to proceed with surgery.   PCP: Gaye Alken, MD  D/C Plans:      Home  Post-op Meds:       No Rx given  Tranexamic Acid:      To be given - IV   Decadron:      Is to be given  FYI:     ASA  Norco  Dilaudid ok, but prn  Add phenergan    Patient Active Problem List   Diagnosis Date Noted  . Expected blood loss anemia 10/16/2012  . Obese 10/16/2012  . S/P right TKA 10/15/2012  . S/P left knee arthroscopy 10/15/2012   Past Medical History  Diagnosis Date  . Hypertension   . Arthritis   . PONV (postoperative nausea and vomiting)     limited turning neck  side to side since cervical fusion    Past Surgical History  Procedure Laterality Date  . Cervical fusion  2011    C2 C3 C4  . Bilateral knee arthroscopic  R 6 years ago L June 2013  . Left foot surgery  Dec. 2011  . Total knee arthroplasty  10/15/2012    Procedure: TOTAL KNEE ARTHROPLASTY;  Surgeon: [name], MD;  Location: [facility];  Service: Orthopedics;  Laterality: Right;  . Knee arthroscopy  10/15/2012    Procedure: ARTHROSCOPY KNEE;  Surgeon: [name], MD;  Location: [facility];  Service: Orthopedics;  Laterality: Left;    No prescriptions prior to admission   No Known Allergies   Social History  Substance Use Topics  . Smoking status: Never Smoker   . Smokeless tobacco: Never Used  . Alcohol Use: Yes     Comment: rare       Review of Systems  Constitutional: Negative.   HENT: Negative.   Eyes: Negative.   Respiratory: Negative.   Cardiovascular: Negative.   Gastrointestinal: Negative.   Genitourinary: Negative.   Musculoskeletal: Positive for joint pain.  Skin: Negative.   Neurological: Negative.   Endo/Heme/Allergies: Negative.   Psychiatric/Behavioral: Negative.     Objective:  Physical Exam  Constitutional: She is oriented to person, place, and time. She appears well-developed.  HENT:  Head: Normocephalic.  Eyes: Pupils are equal,  round, and reactive to light.  Neck: Neck supple. No JVD present. No tracheal deviation present. No thyromegaly present.  Cardiovascular: Normal rate, regular rhythm and intact distal pulses.   Murmur heard. Respiratory: Effort normal and breath sounds normal. No stridor. No respiratory distress. She has no wheezes.  GI: Soft. There is no tenderness. There is no guarding.  Musculoskeletal:       Left knee: She exhibits decreased range of motion, swelling and bony tenderness. She exhibits no ecchymosis, no deformity, no laceration and no erythema. Tenderness found.  Lymphadenopathy:    She has no cervical adenopathy.   Neurological: She is alert and oriented to person, place, and time.  Skin: Skin is warm and dry.  Psychiatric: She has a normal mood and affect.      Labs:  Estimated body mass index is 39.51 kg/(m^2) as calculated from the following:   Height as of 10/15/12: 5\' 1"  (1.549 m).   Weight as of 10/09/12: 94.802 kg (209 lb).   Imaging Review Plain radiographs demonstrate severe degenerative joint disease of the left knee(s).  The bone quality appears to be good for age and reported activity level.  Assessment/Plan:  End stage arthritis, left knee   The patient history, physical examination, clinical judgment of the provider and imaging studies are consistent with end stage degenerative joint disease of the left knee(s) and total knee arthroplasty is deemed medically necessary. The treatment options including medical management, injection therapy arthroscopy and arthroplasty were discussed at length. The risks and benefits of total knee arthroplasty were presented and reviewed. The risks due to aseptic loosening, infection, stiffness, patella tracking problems, thromboembolic complications and other imponderables were discussed. The patient acknowledged the explanation, agreed to proceed with the plan and consent was signed. Patient is being admitted for inpatient treatment for surgery, pain control, PT, OT, prophylactic antibiotics, VTE prophylaxis, progressive ambulation and ADL's and discharge planning. The patient is planning to be discharged home.     Anastasio AuerbachMatthew S. Imari Sivertsen   PA-C  06/01/2016, 7:22 AM

## 2016-06-07 ENCOUNTER — Encounter (HOSPITAL_COMMUNITY)
Admission: RE | Admit: 2016-06-07 | Discharge: 2016-06-07 | Disposition: A | Discharge status: Home / Self Care | Payer: 59 | Source: Ambulatory Visit | Attending: Orthopedic Surgery | Admitting: Orthopedic Surgery

## 2016-06-07 ENCOUNTER — Encounter (HOSPITAL_COMMUNITY): Payer: Self-pay

## 2016-06-07 DIAGNOSIS — Z01810 Encounter for preprocedural cardiovascular examination: Secondary | ICD-10-CM | POA: Insufficient documentation

## 2016-06-07 DIAGNOSIS — Z01812 Encounter for preprocedural laboratory examination: Secondary | ICD-10-CM | POA: Diagnosis present

## 2016-06-07 LAB — CBC
HCT: 37.9 % (ref 36.0–46.0)
Hemoglobin: 12.5 g/dL (ref 12.0–15.0)
MCH: 31.7 pg (ref 26.0–34.0)
MCHC: 33 g/dL (ref 30.0–36.0)
MCV: 96.2 fL (ref 78.0–100.0)
PLATELETS: 319 10*3/uL (ref 150–400)
RBC: 3.94 MIL/uL (ref 3.87–5.11)
RDW: 14.5 % (ref 11.5–15.5)
WBC: 8.2 10*3/uL (ref 4.0–10.5)

## 2016-06-07 LAB — SURGICAL PCR SCREEN
MRSA, PCR: NEGATIVE
Staphylococcus aureus: NEGATIVE

## 2016-06-07 NOTE — Pre-Procedure Instructions (Signed)
CMP results from outside lab are on chart.

## 2016-06-07 NOTE — Pre-Procedure Instructions (Signed)
Cardiac clearance in chart

## 2016-06-07 NOTE — Pre-Procedure Instructions (Signed)
BUN lab report sent to Dr. Charlann [facility]erlin [facility]. CMP was done at an outside lab facility.

## 2016-06-07 NOTE — Patient Instructions (Addendum)
Betty Robinson  06/07/2016   Your procedure is scheduled on: 06/13/16  [facility]  [facility] 3rd floor to  Short Stay Center at 5:30 AM.  Call this number if you have problems the morning of surgery [phone]   Remember: ONLY 1 PERSON MAY GO WITH YOU TO SHORT STAY TO GET  READY MORNING OF YOUR SURGERY.  Do not eat food or drink liquids :After Midnight Monday     Take these medicines the morning of surgery with A SIP OF WATER: none                                You may not have any metal on your body including hair pins and              piercings  Do not wear jewelry, make-up, lotions, powders or perfumes, deodorant             Do not wear nail polish.  Do not shave  48 hours prior to surgery.                 Do not bring valuables to the hospital. [facility] IS NOT             RESPONSIBLE   FOR VALUABLES.  Contacts, dentures or bridgework may not be worn into surgery.  Leave suitcase in the car. After surgery it may be brought to your room.                _____________________________________________________________________             [facility]  Before surgery, you can play an important role.  Because skin is not sterile, your skin needs to be as free of germs as possible.  You can reduce the number of germs on you skin by washing with CHG (chlorahexidine gluconate) soap before surgery.  CHG is an antiseptic cleaner which kills germs and bonds with the skin to continue killing germs even after washing.  Please DO NOT use if you have an allergy to CHG or antibacterial soaps.  If your skin becomes reddened/irritated stop using the CHG and inform your nurse when you arrive at Short Stay.  Do not shave (including legs and underarms) for at least 48 hours prior to the first CHG shower.  You may shave your face.  Please follow these instructions carefully:   1.  Shower with CHG Soap the  night before surgery and the                                morning of Surgery.  2.  If you choose to wash your hair, wash your hair first as usual with your       normal shampoo.  3.  After you shampoo, rinse your hair and body thoroughly to remove the                      Shampoo.  4.  Use CHG as you would any other liquid soap.  You can apply chg directly       to the skin and wash gently with scrungie or a clean washcloth.  5.  Apply the CHG  Soap to your body ONLY FROM THE NECK DOWN.        Do not use on open wounds or open sores.  Avoid contact with your eyes,       ears, mouth and genitals (private parts).  Wash genitals (private parts)       with your normal soap.  6.  Wash thoroughly, paying special attention to the area where your surgery        will be performed.  7.  Thoroughly rinse your body with warm water from the neck down.  8.  DO NOT shower/wash with your normal soap after using and rinsing off       the CHG Soap.  9.  Pat yourself dry with a clean towel.            10.  Wear clean pajamas.            11.  Place clean sheets on your bed the night of your first shower and do not        sleep with pets.  Day of Surgery  Do not apply any lotions/deoderants the morning of surgery.  Please wear clean clothes to the hospital/surgery center.

## 2016-06-13 ENCOUNTER — Inpatient Hospital Stay (HOSPITAL_COMMUNITY): Payer: 59 | Admitting: Registered Nurse

## 2016-06-13 ENCOUNTER — Encounter (HOSPITAL_COMMUNITY)
Admission: RE | Disposition: A | Discharge status: Home Health | Payer: Self-pay | Source: Ambulatory Visit | Attending: Orthopedic Surgery

## 2016-06-13 ENCOUNTER — Encounter (HOSPITAL_COMMUNITY): Payer: Self-pay | Admitting: *Deleted

## 2016-06-13 ENCOUNTER — Inpatient Hospital Stay (HOSPITAL_COMMUNITY)
Admission: RE | Admit: 2016-06-13 | Discharge: 2016-06-14 | DRG: 470 | Disposition: A | Discharge status: Home Health | Payer: 59 | Source: Ambulatory Visit | Attending: Orthopedic Surgery | Admitting: Orthopedic Surgery

## 2016-06-13 DIAGNOSIS — M25762 Osteophyte, left knee: Secondary | ICD-10-CM | POA: Diagnosis present

## 2016-06-13 DIAGNOSIS — M25562 Pain in left knee: Secondary | ICD-10-CM | POA: Diagnosis present

## 2016-06-13 DIAGNOSIS — E669 Obesity, unspecified: Secondary | ICD-10-CM | POA: Diagnosis present

## 2016-06-13 DIAGNOSIS — Z981 Arthrodesis status: Secondary | ICD-10-CM | POA: Diagnosis not present

## 2016-06-13 DIAGNOSIS — M1712 Unilateral primary osteoarthritis, left knee: Secondary | ICD-10-CM | POA: Diagnosis present

## 2016-06-13 DIAGNOSIS — Z96652 Presence of left artificial knee joint: Secondary | ICD-10-CM

## 2016-06-13 DIAGNOSIS — M659 Synovitis and tenosynovitis, unspecified: Secondary | ICD-10-CM | POA: Diagnosis present

## 2016-06-13 DIAGNOSIS — Z96651 Presence of right artificial knee joint: Secondary | ICD-10-CM | POA: Diagnosis present

## 2016-06-13 DIAGNOSIS — Z6837 Body mass index (BMI) 37.0-37.9, adult: Secondary | ICD-10-CM

## 2016-06-13 DIAGNOSIS — I10 Essential (primary) hypertension: Secondary | ICD-10-CM | POA: Diagnosis present

## 2016-06-13 DIAGNOSIS — M25462 Effusion, left knee: Secondary | ICD-10-CM | POA: Diagnosis present

## 2016-06-13 HISTORY — PX: TOTAL KNEE ARTHROPLASTY: SHX125

## 2016-06-13 LAB — TYPE AND SCREEN
ABO/RH(D): O POS
ANTIBODY SCREEN: NEGATIVE

## 2016-06-13 SURGERY — ARTHROPLASTY, KNEE, TOTAL
Anesthesia: Monitor Anesthesia Care | Site: Knee | Laterality: Left

## 2016-06-13 MED ORDER — METOCLOPRAMIDE HCL 5 MG/ML IJ SOLN: 5.0000 mg | Freq: Three times a day (TID) | INTRAMUSCULAR | Status: DC | PRN

## 2016-06-13 MED ORDER — PROMETHAZINE HCL 25 MG/ML IJ SOLN
6.2500 mg | Freq: Four times a day (QID) | INTRAMUSCULAR | Status: DC | PRN
  Administered 2016-06-13: 12.5 mg via INTRAVENOUS
  Filled 2016-06-13: qty 1

## 2016-06-13 MED ORDER — HYDROMORPHONE HCL 1 MG/ML IJ SOLN
0.5000 mg | INTRAMUSCULAR | Status: DC | PRN
  Administered 2016-06-13: 1 mg via INTRAVENOUS
  Filled 2016-06-13: qty 1

## 2016-06-13 MED ORDER — SODIUM CHLORIDE 0.9 % IJ SOLN
INTRAMUSCULAR | Status: AC
  Filled 2016-06-13: qty 50

## 2016-06-13 MED ORDER — ONDANSETRON HCL 4 MG/2ML IJ SOLN: 4.0000 mg | Freq: Once | INTRAMUSCULAR | Status: DC | PRN

## 2016-06-13 MED ORDER — LACTATED RINGERS IV SOLN: INTRAVENOUS | Status: DC

## 2016-06-13 MED ORDER — HYDROCODONE-ACETAMINOPHEN 7.5-325 MG PO TABS
1.0000 | ORAL_TABLET | ORAL | Status: DC
  Administered 2016-06-13 (×2): 1 via ORAL
  Administered 2016-06-14 (×2): 2 via ORAL
  Filled 2016-06-13: qty 2
  Filled 2016-06-13 (×2): qty 1
  Filled 2016-06-13 (×2): qty 2

## 2016-06-13 MED ORDER — DEXAMETHASONE SODIUM PHOSPHATE 10 MG/ML IJ SOLN
10.0000 mg | Freq: Once | INTRAMUSCULAR | Status: AC
  Administered 2016-06-13: 10 mg via INTRAVENOUS

## 2016-06-13 MED ORDER — BISACODYL 10 MG RE SUPP
10.0000 mg | Freq: Every day | RECTAL | Status: DC | PRN
Start: 2016-06-13 — End: 2016-06-14

## 2016-06-13 MED ORDER — FERROUS SULFATE 325 (65 FE) MG PO TABS: 325.0000 mg | ORAL_TABLET | Freq: Three times a day (TID) | ORAL | 3 refills | Status: DC

## 2016-06-13 MED ORDER — PROPOFOL 10 MG/ML IV BOLUS
INTRAVENOUS | Status: AC
  Filled 2016-06-13: qty 20

## 2016-06-13 MED ORDER — POLYETHYLENE GLYCOL 3350 17 G PO PACK
17.0000 g | PACK | Freq: Two times a day (BID) | ORAL | Status: DC
  Administered 2016-06-13 – 2016-06-14 (×2): 17 g via ORAL
  Filled 2016-06-13 (×2): qty 1

## 2016-06-13 MED ORDER — OXYCODONE HCL 5 MG/5ML PO SOLN
5.0000 mg | Freq: Once | ORAL | Status: DC | PRN
  Filled 2016-06-13: qty 5

## 2016-06-13 MED ORDER — CEFAZOLIN SODIUM-DEXTROSE 2-4 GM/100ML-% IV SOLN
INTRAVENOUS | Status: AC
  Filled 2016-06-13: qty 100

## 2016-06-13 MED ORDER — ONDANSETRON HCL 4 MG PO TABS
4.0000 mg | ORAL_TABLET | Freq: Four times a day (QID) | ORAL | Status: DC | PRN
  Administered 2016-06-14: 4 mg via ORAL
  Filled 2016-06-13: qty 1

## 2016-06-13 MED ORDER — PROPOFOL 500 MG/50ML IV EMUL
INTRAVENOUS | Status: DC | PRN
  Administered 2016-06-13: 100 ug/kg/min via INTRAVENOUS

## 2016-06-13 MED ORDER — METHOCARBAMOL 500 MG PO TABS: 500.0000 mg | ORAL_TABLET | Freq: Four times a day (QID) | ORAL | 0 refills | Status: DC | PRN

## 2016-06-13 MED ORDER — PROMETHAZINE HCL 25 MG PO TABS
12.5000 mg | ORAL_TABLET | Freq: Four times a day (QID) | ORAL | Status: DC | PRN
  Filled 2016-06-13: qty 1

## 2016-06-13 MED ORDER — MIDAZOLAM HCL 2 MG/2ML IJ SOLN
INTRAMUSCULAR | Status: AC
  Filled 2016-06-13: qty 2

## 2016-06-13 MED ORDER — CELECOXIB 200 MG PO CAPS
200.0000 mg | ORAL_CAPSULE | Freq: Two times a day (BID) | ORAL | Status: DC
  Administered 2016-06-13 – 2016-06-14 (×2): 200 mg via ORAL
  Filled 2016-06-13 (×2): qty 1

## 2016-06-13 MED ORDER — POTASSIUM CHLORIDE 2 MEQ/ML IV SOLN
INTRAVENOUS | Status: DC
  Administered 2016-06-13 – 2016-06-14 (×2): via INTRAVENOUS
  Filled 2016-06-13 (×4): qty 1000

## 2016-06-13 MED ORDER — HYDROMORPHONE HCL 1 MG/ML IJ SOLN
0.2500 mg | INTRAMUSCULAR | Status: DC | PRN
  Administered 2016-06-13 (×3): 0.25 mg via INTRAVENOUS

## 2016-06-13 MED ORDER — DEXAMETHASONE SODIUM PHOSPHATE 10 MG/ML IJ SOLN
INTRAMUSCULAR | Status: AC
  Filled 2016-06-13: qty 1

## 2016-06-13 MED ORDER — MIDAZOLAM HCL 5 MG/5ML IJ SOLN
INTRAMUSCULAR | Status: DC | PRN
  Administered 2016-06-13: 2 mg via INTRAVENOUS
  Administered 2016-06-13: 1 mg via INTRAVENOUS

## 2016-06-13 MED ORDER — BUPIVACAINE-EPINEPHRINE (PF) 0.25% -1:200000 IJ SOLN
INTRAMUSCULAR | Status: DC | PRN
  Administered 2016-06-13: 30 mL

## 2016-06-13 MED ORDER — SODIUM CHLORIDE 0.9 % IR SOLN
Status: DC | PRN
  Administered 2016-06-13: 1000 mL

## 2016-06-13 MED ORDER — TRANEXAMIC ACID 1000 MG/10ML IV SOLN
1000.0000 mg | Freq: Once | INTRAVENOUS | Status: AC
  Administered 2016-06-13: 1000 mg via INTRAVENOUS
  Filled 2016-06-13: qty 10

## 2016-06-13 MED ORDER — FERROUS SULFATE 325 (65 FE) MG PO TABS
325.0000 mg | ORAL_TABLET | Freq: Three times a day (TID) | ORAL | Status: DC
  Administered 2016-06-14 (×2): 325 mg via ORAL
  Filled 2016-06-13 (×3): qty 1

## 2016-06-13 MED ORDER — OXYCODONE HCL 5 MG PO TABS: 5.0000 mg | ORAL_TABLET | Freq: Once | ORAL | Status: DC | PRN

## 2016-06-13 MED ORDER — BUPIVACAINE-EPINEPHRINE (PF) 0.25% -1:200000 IJ SOLN
INTRAMUSCULAR | Status: AC
  Filled 2016-06-13: qty 30

## 2016-06-13 MED ORDER — ALUM & MAG HYDROXIDE-SIMETH 200-200-20 MG/5ML PO SUSP: 30.0000 mL | ORAL | Status: DC | PRN

## 2016-06-13 MED ORDER — CHLORHEXIDINE GLUCONATE 4 % EX LIQD
60.0000 mL | Freq: Once | CUTANEOUS | Status: DC
Start: 2016-06-13 — End: 2016-06-13

## 2016-06-13 MED ORDER — METHOCARBAMOL 500 MG PO TABS
500.0000 mg | ORAL_TABLET | Freq: Four times a day (QID) | ORAL | Status: DC | PRN
  Administered 2016-06-14 (×2): 500 mg via ORAL
  Filled 2016-06-13: qty 1

## 2016-06-13 MED ORDER — HYDROMORPHONE HCL 1 MG/ML IJ SOLN
INTRAMUSCULAR | Status: AC
  Filled 2016-06-13: qty 1

## 2016-06-13 MED ORDER — ONDANSETRON HCL 4 MG/2ML IJ SOLN
INTRAMUSCULAR | Status: DC | PRN
  Administered 2016-06-13: 4 mg via INTRAVENOUS

## 2016-06-13 MED ORDER — ASPIRIN 81 MG PO CHEW: 81.0000 mg | CHEWABLE_TABLET | Freq: Two times a day (BID) | ORAL | 0 refills | Status: AC

## 2016-06-13 MED ORDER — MENTHOL 3 MG MT LOZG: 1.0000 | LOZENGE | OROMUCOSAL | Status: DC | PRN

## 2016-06-13 MED ORDER — ONDANSETRON HCL 4 MG/2ML IJ SOLN
INTRAMUSCULAR | Status: AC
  Filled 2016-06-13: qty 2

## 2016-06-13 MED ORDER — STERILE WATER FOR IRRIGATION IR SOLN
Status: DC | PRN
  Administered 2016-06-13: 3000 mL

## 2016-06-13 MED ORDER — PROPOFOL 10 MG/ML IV BOLUS
INTRAVENOUS | Status: AC
  Filled 2016-06-13: qty 60

## 2016-06-13 MED ORDER — METHOCARBAMOL 1000 MG/10ML IJ SOLN
500.0000 mg | Freq: Four times a day (QID) | INTRAMUSCULAR | Status: DC | PRN
  Filled 2016-06-13: qty 5

## 2016-06-13 MED ORDER — BUPIVACAINE IN DEXTROSE 0.75-8.25 % IT SOLN
INTRATHECAL | Status: DC | PRN
  Administered 2016-06-13: 1.4 mL via INTRATHECAL

## 2016-06-13 MED ORDER — DOCUSATE SODIUM 100 MG PO CAPS: 100.0000 mg | ORAL_CAPSULE | Freq: Two times a day (BID) | ORAL | 0 refills | Status: DC

## 2016-06-13 MED ORDER — FENTANYL CITRATE (PF) 100 MCG/2ML IJ SOLN
INTRAMUSCULAR | Status: AC
  Filled 2016-06-13: qty 2

## 2016-06-13 MED ORDER — ACETAMINOPHEN 10 MG/ML IV SOLN
INTRAVENOUS | Status: AC
  Filled 2016-06-13: qty 100

## 2016-06-13 MED ORDER — GLYCOPYRROLATE 0.2 MG/ML IJ SOLN
INTRAMUSCULAR | Status: AC
  Filled 2016-06-13: qty 1

## 2016-06-13 MED ORDER — 0.9 % SODIUM CHLORIDE (POUR BTL) OPTIME
TOPICAL | Status: DC | PRN
  Administered 2016-06-13: 1000 mL

## 2016-06-13 MED ORDER — KETOROLAC TROMETHAMINE 30 MG/ML IJ SOLN
INTRAMUSCULAR | Status: DC | PRN
  Administered 2016-06-13: 30 mg

## 2016-06-13 MED ORDER — LACTATED RINGERS IV SOLN
INTRAVENOUS | Status: DC | PRN
  Administered 2016-06-13 (×3): via INTRAVENOUS

## 2016-06-13 MED ORDER — TRANEXAMIC ACID 1000 MG/10ML IV SOLN
1000.0000 mg | INTRAVENOUS | Status: AC
  Administered 2016-06-13: 1000 mg via INTRAVENOUS
  Filled 2016-06-13: qty 10

## 2016-06-13 MED ORDER — HYDROCODONE-ACETAMINOPHEN 7.5-325 MG PO TABS: 1.0000 | ORAL_TABLET | ORAL | 0 refills | Status: DC | PRN

## 2016-06-13 MED ORDER — CEFAZOLIN SODIUM-DEXTROSE 2-4 GM/100ML-% IV SOLN
2.0000 g | Freq: Four times a day (QID) | INTRAVENOUS | Status: AC
  Administered 2016-06-13 (×2): 2 g via INTRAVENOUS
  Filled 2016-06-13 (×3): qty 100

## 2016-06-13 MED ORDER — PHENOL 1.4 % MT LIQD: 1.0000 | OROMUCOSAL | Status: DC | PRN

## 2016-06-13 MED ORDER — ONDANSETRON HCL 4 MG/2ML IJ SOLN
4.0000 mg | Freq: Four times a day (QID) | INTRAMUSCULAR | Status: DC | PRN
  Administered 2016-06-13 (×2): 4 mg via INTRAVENOUS
  Filled 2016-06-13 (×2): qty 2

## 2016-06-13 MED ORDER — SODIUM CHLORIDE 0.9 % IJ SOLN
INTRAMUSCULAR | Status: DC | PRN
  Administered 2016-06-13: 30 mL

## 2016-06-13 MED ORDER — FENTANYL CITRATE (PF) 100 MCG/2ML IJ SOLN
INTRAMUSCULAR | Status: DC | PRN
  Administered 2016-06-13: 100 ug via INTRAVENOUS

## 2016-06-13 MED ORDER — ASPIRIN 81 MG PO CHEW
81.0000 mg | CHEWABLE_TABLET | Freq: Two times a day (BID) | ORAL | Status: DC
  Administered 2016-06-13 – 2016-06-14 (×2): 81 mg via ORAL
  Filled 2016-06-13 (×2): qty 1

## 2016-06-13 MED ORDER — DEXAMETHASONE SODIUM PHOSPHATE 10 MG/ML IJ SOLN
10.0000 mg | Freq: Once | INTRAMUSCULAR | Status: AC
  Administered 2016-06-14: 10 mg via INTRAVENOUS
  Filled 2016-06-13: qty 1

## 2016-06-13 MED ORDER — DIPHENHYDRAMINE HCL 25 MG PO CAPS: 25.0000 mg | ORAL_CAPSULE | Freq: Four times a day (QID) | ORAL | Status: DC | PRN

## 2016-06-13 MED ORDER — KETOROLAC TROMETHAMINE 30 MG/ML IJ SOLN
INTRAMUSCULAR | Status: AC
  Filled 2016-06-13: qty 1

## 2016-06-13 MED ORDER — POLYETHYLENE GLYCOL 3350 17 G PO PACK: 17.0000 g | PACK | Freq: Two times a day (BID) | ORAL | 0 refills | Status: DC

## 2016-06-13 MED ORDER — DOCUSATE SODIUM 100 MG PO CAPS
100.0000 mg | ORAL_CAPSULE | Freq: Two times a day (BID) | ORAL | Status: DC
  Administered 2016-06-13 – 2016-06-14 (×2): 100 mg via ORAL
  Filled 2016-06-13 (×2): qty 1

## 2016-06-13 MED ORDER — CEFAZOLIN SODIUM-DEXTROSE 2-4 GM/100ML-% IV SOLN
2.0000 g | INTRAVENOUS | Status: AC
  Administered 2016-06-13: 2 g via INTRAVENOUS
  Filled 2016-06-13: qty 100

## 2016-06-13 MED ORDER — METOCLOPRAMIDE HCL 5 MG PO TABS: 5.0000 mg | ORAL_TABLET | Freq: Three times a day (TID) | ORAL | Status: DC | PRN

## 2016-06-13 MED ORDER — MAGNESIUM CITRATE PO SOLN: 1.0000 | Freq: Once | ORAL | Status: DC | PRN

## 2016-06-13 SURGICAL SUPPLY — 46 items
BAG DECANTER FOR FLEXI CONT (MISCELLANEOUS) IMPLANT
BAG SPEC THK2 15X12 ZIP CLS (MISCELLANEOUS)
BAG ZIPLOCK 12X15 (MISCELLANEOUS) IMPLANT
BANDAGE ACE 6X5 VEL STRL LF (GAUZE/BANDAGES/DRESSINGS) ×3 IMPLANT
BLADE SAW SGTL 13.0X1.19X90.0M (BLADE) ×3 IMPLANT
BOWL SMART MIX CTS (DISPOSABLE) ×3 IMPLANT
CAPT KNEE TOTAL 3 ATTUNE ×3 IMPLANT
CEMENT HV SMART SET (Cement) ×4 IMPLANT
CLOTH BEACON ORANGE TIMEOUT ST (SAFETY) ×3 IMPLANT
CUFF TOURN SGL QUICK 34 (TOURNIQUET CUFF) ×3
CUFF TRNQT CYL 34X4X40X1 (TOURNIQUET CUFF) ×1 IMPLANT
DECANTER SPIKE VIAL GLASS SM (MISCELLANEOUS) ×3 IMPLANT
DRAPE U-SHAPE 47X51 STRL (DRAPES) ×3 IMPLANT
DRESSING AQUACEL AG SP 3.5X10 (GAUZE/BANDAGES/DRESSINGS) ×1 IMPLANT
DRSG AQUACEL AG ADV 3.5X10 (GAUZE/BANDAGES/DRESSINGS) ×2 IMPLANT
DRSG AQUACEL AG SP 3.5X10 (GAUZE/BANDAGES/DRESSINGS) ×3
DURAPREP 26ML APPLICATOR (WOUND CARE) ×6 IMPLANT
ELECT REM PT RETURN 9FT ADLT (ELECTROSURGICAL) ×3
ELECTRODE REM PT RTRN 9FT ADLT (ELECTROSURGICAL) ×1 IMPLANT
GLOVE BIOGEL M 7.0 STRL (GLOVE) ×4 IMPLANT
GLOVE BIOGEL PI IND STRL 7.5 (GLOVE) ×6 IMPLANT
GLOVE BIOGEL PI IND STRL 8.5 (GLOVE) ×1 IMPLANT
GLOVE BIOGEL PI INDICATOR 7.5 (GLOVE) ×12
GLOVE BIOGEL PI INDICATOR 8.5 (GLOVE) ×2
GLOVE ECLIPSE 8.0 STRL XLNG CF (GLOVE) ×3 IMPLANT
GLOVE ORTHO TXT STRL SZ7.5 (GLOVE) ×6 IMPLANT
GOWN STRL REUS W/TWL LRG LVL3 (GOWN DISPOSABLE) ×9 IMPLANT
GOWN STRL REUS W/TWL XL LVL3 (GOWN DISPOSABLE) ×1 IMPLANT
HANDPIECE INTERPULSE COAX TIP (DISPOSABLE) ×3
LIQUID BAND (GAUZE/BANDAGES/DRESSINGS) ×3 IMPLANT
MANIFOLD NEPTUNE II (INSTRUMENTS) ×3 IMPLANT
PACK TOTAL KNEE CUSTOM (KITS) ×3 IMPLANT
POSITIONER SURGICAL ARM (MISCELLANEOUS) ×3 IMPLANT
SET HNDPC FAN SPRY TIP SCT (DISPOSABLE) ×1 IMPLANT
SET PAD KNEE POSITIONER (MISCELLANEOUS) ×3 IMPLANT
SUT MNCRL AB 4-0 PS2 18 (SUTURE) ×3 IMPLANT
SUT VIC AB 1 CT1 36 (SUTURE) ×3 IMPLANT
SUT VIC AB 2-0 CT1 27 (SUTURE) ×9
SUT VIC AB 2-0 CT1 TAPERPNT 27 (SUTURE) ×3 IMPLANT
SUT VLOC 180 0 24IN GS25 (SUTURE) ×3 IMPLANT
SYR 50ML LL SCALE MARK (SYRINGE) ×3 IMPLANT
TRAY FOLEY W/METER SILVER 14FR (SET/KITS/TRAYS/PACK) ×3 IMPLANT
TRAY FOLEY W/METER SILVER 16FR (SET/KITS/TRAYS/PACK) ×3 IMPLANT
WATER STERILE IRR 1500ML POUR (IV SOLUTION) ×3 IMPLANT
WRAP KNEE MAXI GEL POST OP (GAUZE/BANDAGES/DRESSINGS) ×3 IMPLANT
YANKAUER SUCT BULB TIP 10FT TU (MISCELLANEOUS) ×3 IMPLANT

## 2016-06-13 NOTE — Anesthesia Procedure Notes (Addendum)
Spinal  Patient location during procedure: OR End time: 06/13/2016 9:14 AM Staffing Resident/CRNA: Enrigue Catena E Performed: anesthesiologist and resident/CRNA  Preanesthetic Checklist Completed: patient identified, site marked, surgical consent, pre-op evaluation, timeout performed, IV checked, risks and benefits discussed and monitors and equipment checked Spinal Block Patient position: sitting Prep: Betadine and ChloraPrep Patient monitoring: heart rate, continuous pulse ox and blood pressure Approach: right paramedian Location: L3-4 Injection technique: single-shot Needle Needle type: Sprotte  Needle gauge: 24 G Needle length: 9 cm Assessment Sensory level: T8 Additional Notes Expiration date of kit checked and confirmed. Patient tolerated procedure well, without complications.

## 2016-06-13 NOTE — Anesthesia Preprocedure Evaluation (Signed)
Anesthesia Evaluation  Patient identified by MRN, date of birth, ID band Patient awake    Reviewed: Allergy & Precautions, NPO status , Patient's Chart, lab work & pertinent test results  Airway Mallampati: II  TM Distance: >3 FB Neck ROM: Full    Dental  (+) Teeth Intact, Dental Advisory Given   Pulmonary    breath sounds clear to auscultation       Cardiovascular  Rhythm:Regular Rate:Normal     Neuro/Psych    GI/Hepatic   Endo/Other    Renal/GU      Musculoskeletal   Abdominal   Peds  Hematology   Anesthesia Other Findings   Reproductive/Obstetrics                             Anesthesia Physical Anesthesia Plan  ASA: III  Anesthesia Plan: MAC and Spinal   Post-op Pain Management:    Induction: Intravenous  Airway Management Planned: Natural Airway and Simple Face Mask  Additional Equipment:   Intra-op Plan:   Post-operative Plan:   Informed Consent: I have reviewed the patients History and Physical, chart, labs and discussed the procedure including the risks, benefits and alternatives for the proposed anesthesia with the patient or authorized representative who has indicated his/her understanding and acceptance.   Dental advisory given  Plan Discussed with: CRNA and Anesthesiologist  Anesthesia Plan Comments:         Anesthesia Quick Evaluation  

## 2016-06-13 NOTE — Anesthesia Postprocedure Evaluation (Signed)
Anesthesia Post Note  Patient: Betty Robinson  Procedure(s) Performed: Procedure(s) (LRB): LEFT TOTAL KNEE ARTHROPLASTY (Left)  Patient location during evaluation: PACU Anesthesia Type: MAC and Spinal Level of consciousness: awake and alert, awake and oriented Pain management: pain level controlled Vital Signs Assessment: post-procedure vital signs reviewed and stable Respiratory status: spontaneous breathing, nonlabored ventilation and respiratory function stable Cardiovascular status: blood pressure returned to baseline Anesthetic complications: no    Last Vitals:  Vitals:   06/13/16 1531 06/13/16 1634  BP: (!) 151/89 (!) 158/79  Pulse: 65 64  Resp: 14 16  Temp: 36.8 C 36.7 C    Last Pain:  Vitals:   06/13/16 1634  TempSrc: Oral  PainSc:                  Deanda Ruddell COKER

## 2016-06-13 NOTE — Op Note (Signed)
NAME:  Betty Robinson                      MEDICAL RECORD NO.:  [id number]                             FACILITY:  [facility]      PHYSICIAN:  Madlyn Frankel. Charlann Boxer, M.D.  DATE OF BIRTH:  January 10, 1956      DATE OF PROCEDURE:  06/13/2016                                     OPERATIVE REPORT         PREOPERATIVE DIAGNOSIS:  Left knee osteoarthritis.      POSTOPERATIVE DIAGNOSIS:  Left knee osteoarthritis.      FINDINGS:  The patient was noted to have complete loss of cartilage and   bone-on-bone arthritis with associated osteophytes in the medial and patellofemoral compartments of   the knee with a significant synovitis and associated effusion.      PROCEDURE:  Left total knee replacement.      COMPONENTS USED:  DePuy Attune rotating platform posterior stabilized knee   system, a size 4N femur, 3 tibia, size 5 mm PS AOX insert, and 35 anatomic patellar   button.      SURGEON:  Madlyn Frankel. Charlann Boxer, M.D.      ASSISTANT:  Wyman Songster, PA-C.      ANESTHESIA:  Spinal.      SPECIMENS:  None.      COMPLICATION:  None.      DRAINS:  One Hemovac.  EBL: <50cc      TOURNIQUET TIME:   Total Tourniquet Time Documented: Thigh (Left) - 32 minutes Total: Thigh (Left) - 32 minutes  .      The patient was stable to the recovery room.      INDICATION FOR PROCEDURE:  Betty Robinson is a 60 y.o. female patient of   mine.  The patient had been seen, evaluated, and treated conservatively in the   office with medication, activity modification, and injections.  The patient had   radiographic changes of bone-on-bone arthritis with endplate sclerosis and osteophytes noted.      The patient failed conservative measures including medication, injections, and activity modification, and at this point was ready for more definitive measures.   Based on the radiographic changes and failed conservative measures, the patient   decided to proceed with total knee replacement.  Risks of infection,   DVT, component failure,  need for revision surgery, postop course, and   expectations were all   discussed and reviewed.  Consent was obtained for benefit of pain   relief.      PROCEDURE IN DETAIL:  The patient was brought to the operative theater.   Once adequate anesthesia, preoperative antibiotics, 2 gm of Ancef, 1 gm of Tranexamic Acid, and 10 mg of Decadron administered, the patient was positioned supine with the left thigh tourniquet placed.  The  left lower extremity was prepped and draped in sterile fashion.  A time-   out was performed identifying the patient, planned procedure, and   extremity.      The left lower extremity was placed in the [facility] leg holder.  The leg was   exsanguinated, tourniquet elevated to 250 mmHg.  A midline incision was  made followed by median parapatellar arthrotomy.  Following initial   exposure, attention was first directed to the patella.  Precut   measurement was noted to be 23 mm.  I resected down to 14 mm and used a   35 anatomic patellar button to restore patellar height as well as cover the cut   surface.      The lug holes were drilled and a metal shim was placed to protect the   patella from retractors and saw blades.      At this point, attention was now directed to the femur.  The femoral   canal was opened with a drill, irrigated to try to prevent fat emboli.  An   intramedullary rod was passed at 3 degrees valgus, 9 mm of bone was   resected off the distal femur.  Following this resection, the tibia was   subluxated anteriorly.  Using the extramedullary guide, 2  mm of bone was resected off   the proximal medial tibia.  We confirmed the gap would be   stable medially and laterally with a size 5 mm insert as well as confirmed   the cut was perpendicular in the coronal plane, checking with an alignment rod.      Once this was done, I sized the femur to be a size 4 in the anterior-   posterior dimension, chose a narrow component based on medial and   lateral  dimension.  The size 4 rotation block was then pinned in   position anterior referenced using the C-clamp to set rotation.  The   anterior, posterior, and  chamfer cuts were made without difficulty nor   notching making certain that I was along the anterior cortex to help   with flexion gap stability.      The final box cut was made off the lateral aspect of distal femur.      At this point, the tibia was sized to be a size 3, the size 3 tray was   then pinned in position through the medial third of the tubercle,   drilled, and keel punched.  Trial reduction was now carried with a 4 femur,  3 tibia, a size 5 mm PS insert, and the 35 anatomic patella botton.  The knee was brought to   extension, full extension with good flexion stability with the patella   tracking through the trochlea without application of pressure.  Given   all these findings the final femoral lugs were drilled and then the trial components removed.  Final components were   opened and cement was mixed.  The knee was irrigated with normal saline   solution and pulse lavage.  The synovial lining was   then injected with 30 cc 0.25% Marcaine with epinephrine and 1 cc of Toradol plus 30 cc of NS for a total of 61 cc.      The knee was irrigated.  Final implants were then cemented onto clean and   dried cut surfaces of bone with the knee brought to extension with a size 5 mm trial insert.      Once the cement had fully cured, the excess cement was removed   throughout the knee.  I confirmed I was satisfied with the range of   motion and stability, and the final size 5 mm PS AOX insert was chosen.  It was   placed into the knee.      The tourniquet had been let down at 32  minutes.  No significant   hemostasis required.  The   extensor mechanism was then reapproximated using #1 Vicryl and #0 V-lock sutures with the knee   in flexion.  The   remaining wound was closed with 2-0 Vicryl and running 4-0 Monocryl.   The knee was  cleaned, dried, dressed sterilely using Dermabond and   Aquacel dressing.  The patient was then   brought to recovery room in stable condition, tolerating the procedure   well.   Please note that Physician Assistant, Skip Mayer, PA-C, was present for the entirety of the case, and was utilized for pre-operative positioning, peri-operative retractor management, general facilitation of the procedure.  He was also utilized for primary wound closure at the end of the case.              Madlyn Frankel Charlann Boxer, M.D.    06/13/2016 10:27 AM

## 2016-06-13 NOTE — Interval H&P Note (Signed)
History and Physical Interval Note:  06/13/2016 7:19 AM  Betty Robinson  has presented today for surgery, with the diagnosis of LEFT KNEE OA  The various methods of treatment have been discussed with the patient and family. After consideration of risks, benefits and other options for treatment, the patient has consented to  Procedure(s): LEFT TOTAL KNEE ARTHROPLASTY (Left) as a surgical intervention .  The patient's history has been reviewed, patient examined, no change in status, stable for surgery.  I have reviewed the patient's chart and labs.  Questions were answered to the patient's satisfaction.     [name]

## 2016-06-13 NOTE — Transfer of Care (Signed)
Immediate Anesthesia Transfer of Care Note  Patient: Betty Robinson  Procedure(s) Performed: Procedure(s): LEFT TOTAL KNEE ARTHROPLASTY (Left)  Patient Location: PACU  Anesthesia Type:Spinal  Level of Consciousness: awake, alert , oriented and patient cooperative  Airway & Oxygen Therapy: Patient Spontanous Breathing and Patient connected to face mask oxygen  Post-op Assessment: Report given to RN, Post -op Vital signs reviewed and stable and Patient moving all extremities X 4  Post vital signs: stable  Last Vitals:  Vitals:   06/13/16 0551  BP: (!) 152/83  Pulse: 86  Resp: 18  Temp: 36.6 C    Last Pain:  Vitals:   06/13/16 0551  TempSrc: Oral      Patients Stated Pain Goal: 4 (06/13/16 1791)  Complications: No apparent anesthesia complications

## 2016-06-13 NOTE — Discharge Instructions (Signed)

## 2016-06-14 LAB — CBC
HEMATOCRIT: 31.9 % — AB (ref 36.0–46.0)
Hemoglobin: 10.6 g/dL — ABNORMAL LOW (ref 12.0–15.0)
MCH: 32.2 pg (ref 26.0–34.0)
MCHC: 33.2 g/dL (ref 30.0–36.0)
MCV: 97 fL (ref 78.0–100.0)
PLATELETS: 290 10*3/uL (ref 150–400)
RBC: 3.29 MIL/uL — ABNORMAL LOW (ref 3.87–5.11)
RDW: 14.4 % (ref 11.5–15.5)
WBC: 14 10*3/uL — AB (ref 4.0–10.5)

## 2016-06-14 LAB — BASIC METABOLIC PANEL
ANION GAP: 6 (ref 5–15)
BUN: 17 mg/dL (ref 6–20)
CALCIUM: 8.2 mg/dL — AB (ref 8.9–10.3)
CO2: 26 mmol/L (ref 22–32)
CREATININE: 0.59 mg/dL (ref 0.44–1.00)
Chloride: 106 mmol/L (ref 101–111)
GFR calc Af Amer: >60 mL/min (ref >60)
GLUCOSE: 134 mg/dL — AB (ref 65–99)
Potassium: 4.9 mmol/L (ref 3.5–5.1)
Sodium: 138 mmol/L (ref 135–145)

## 2016-06-14 MED ORDER — PROMETHAZINE HCL 12.5 MG PO TABS: 12.5000 mg | ORAL_TABLET | Freq: Four times a day (QID) | ORAL | 0 refills | Status: DC | PRN

## 2016-06-14 NOTE — Care Management Note (Signed)
Case Management Note  Patient Details  Name: Betty Robinson MRN: [id number] Date of Birth: Aug 05, 1956  Subjective/Objective:                  LEFT TOTAL KNEE ARTHROPLASTY (Left)  Action/Plan: Discharge planning Expected Discharge Date:  06/14/16               Expected Discharge Plan:  [place]  In-House Referral:     Discharge planning Services  CM Consult  Post Acute Care Choice:  Home Health Choice offered to:  Patient  DME Arranged:  3-N-1 DME Agency:  [place]:  PT [place]:  [facility] (now [facility])  Status of Service:  Completed, signed off  If discussed at [employer] of Stay Meetings, dates discussed:    Additional Comments: CM met with pt in room to offer choice of home health agency.  Pt chooses [facility] to render HHPT.  Referral given to [facility] rep, tim.  [facility] rep, Jermaine to please deliver the 3n1 to room prior to pt discharge.  No other CM needs were communicated. Dellie Catholic, RN 06/14/2016, 10:49 AM

## 2016-06-14 NOTE — Progress Notes (Signed)
     Subjective: 1 Day Post-Op Procedure(s) (LRB): LEFT TOTAL KNEE ARTHROPLASTY (Left)   Patient reports pain as mild, pain controlled.  No events throughout the night.  Procedure was discussed.  Ready to be discharged home.  Objective:   VITALS:   Vitals:   06/14/16 0140 06/14/16 0524  BP: 126/67 124/69  Pulse: 72 65  Resp: 16 16  Temp: 97.7 F (36.5 C) 97.7 F (36.5 C)    Dorsiflexion/Plantar flexion intact Incision: dressing C/D/I No cellulitis present Compartment soft  LABS  Recent Labs  06/14/16 0413  HGB 10.6*  HCT 31.9*  WBC 14.0*  PLT 290     Recent Labs  06/14/16 0413  NA 138  K 4.9  BUN 17  CREATININE 0.59  GLUCOSE 134*     Assessment/Plan: 1 Day Post-Op Procedure(s) (LRB): LEFT TOTAL KNEE ARTHROPLASTY (Left) Foley cath d/c'ed Advance diet Up with therapy D/C IV fluids Discharge home with home health  Follow up in 2 weeks at [facility]. Follow up with OLIN,Eliane Hammersmith D in 2 weeks.  Contact information:  [facility] [address] [place] [address] [phone]    Obese (BMI 30-39.9) Estimated body mass index is 37.6 kg/m as calculated from the following:   Height as of this encounter: 5\' 1"  (1.549 m).   Weight as of this encounter: 90.3 kg (199 lb). Patient also counseled that weight may inhibit the healing process Patient counseled that losing weight will help with future health issues         Anastasio Auerbach. Theran Vandergrift   PAC  06/14/2016, 7:57 AM

## 2016-06-14 NOTE — Evaluation (Signed)
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  Pt should progress to dc home with family assist and HHPT follow up.    Follow Up Recommendations Home health PT    Equipment Recommendations  None recommended by PT    Recommendations for Other Services OT consult     Precautions / Restrictions Precautions Precautions: Fall;Knee Restrictions Weight Bearing Restrictions: No LLE Weight Bearing: Weight bearing as tolerated      Mobility  Bed Mobility Overal bed mobility: Needs Assistance Bed Mobility: Supine to Sit     Supine to sit: Min guard     General bed mobility comments: cues for sequence and use of R LE to self assist  Transfers Overall transfer level: Needs assistance Equipment used: Rolling walker (2 wheeled) Transfers: Sit to/from Stand Sit to Stand: Min guard;Supervision         General transfer comment: cues for LE management and use of UEs to self assist  Ambulation/Gait Ambulation/Gait assistance: Min guard;Supervision Ambulation Distance (Feet): 80 Feet Assistive device: Rolling walker (2 wheeled) Gait Pattern/deviations: Step-to pattern;Decreased step length - right;Decreased step length - left;Shuffle;Trunk flexed Gait velocity: decr Gait velocity interpretation: Below normal speed for age/gender General Gait Details: cues for sequence, posture and position from RW  Stairs Stairs: Yes Stairs assistance: Min guard Stair Management: Two rails;Step to pattern;Forwards Number of Stairs: 5 General stair comments: cues for sequence and foot placement; spouse assisting  Wheelchair Mobility    Modified Rankin (Stroke Patients Only)       Balance                                              Pertinent Vitals/Pain Pain Assessment: 0-10 Pain Score: 3  Pain Location: L knee Pain Descriptors / Indicators: Aching;Sore Pain Intervention(s): Limited activity within patient's tolerance;Monitored during session;Premedicated before session;Ice applied    Home Living Family/patient expects to be discharged to:: Private residence Living Arrangements: Spouse/significant other Available Help at Discharge: Family Type of Home: House Home Access: Stairs to enter Entrance Stairs-Rails: Right;Left;Can reach both Secretary/administrator of Steps: 3 Home Layout: One level Home Equipment: Environmental consultant - 2 wheels      Prior Function Level of Independence: Independent               Hand Dominance        Extremity/Trunk Assessment   Upper Extremity Assessment: Overall WFL for tasks assessed           Lower Extremity Assessment: LLE deficits/detail   LLE Deficits / Details: 3/5 quads with IND SLR and AAROM -10 - 45  Cervical / Trunk Assessment: Normal  Communication   Communication: No difficulties  Cognition Arousal/Alertness: Awake/alert Behavior During Therapy: WFL for tasks assessed/performed Overall Cognitive Status: Within Functional Limits for tasks assessed                      General Comments      Exercises Total Joint Exercises Ankle Circles/Pumps: AROM;Both;15 reps;Supine Quad Sets: AROM;Both;10 reps;Supine Heel Slides: AAROM;Left;15 reps;Supine Straight Leg Raises: AAROM;AROM;Left;15 reps;Supine      Assessment/Plan    PT  Assessment Patient needs continued PT services  PT Diagnosis Difficulty walking   PT Problem List Decreased strength;Decreased range of motion;Decreased activity tolerance;Decreased mobility;Decreased knowledge of use of DME;Pain  PT Treatment Interventions DME instruction;Gait training;Stair training;Functional mobility training;Therapeutic activities;Therapeutic exercise;Patient/family education   PT Goals  (Current goals can be found in the Care Plan section) Acute Rehab PT Goals Patient Stated Goal: Regain IND to show my dogs again PT Goal Formulation: With patient Time For Goal Achievement: 06/15/16 Potential to Achieve Goals: Good    Frequency 7X/week   Barriers to discharge        Co-evaluation               End of Session Equipment Utilized During Treatment: Gait belt Activity Tolerance: Patient tolerated treatment well Patient left: in chair;with call bell/phone within reach;with family/visitor present Nurse Communication: Mobility status         Time: 1115-1145 PT Time Calculation (min) (ACUTE ONLY): 30 min   Charges:   PT Evaluation $PT Eval Low Complexity: 1 Procedure PT Treatments $Therapeutic Exercise: 8-22 mins   PT G Codes:        Betty Robinson 2016/06/21, 12:31 PM

## 2016-06-14 NOTE — Progress Notes (Signed)
OT Cancellation Note  Patient Details Name: Betty Robinson MRN: [id number] DOB: 1956-06-28   Cancelled Treatment:    Reason Eval/Treat Not Completed: OT screened, no needs identified, will sign off  Evelyna Folker, [place] Kung 06/14/2016, 10:47 AM

## 2016-06-14 NOTE — Progress Notes (Addendum)
Discharge instructions and prescriptions  reviewed with patient utilizing teach back method no questions at this time. Patient discharged to home.

## 2016-06-14 NOTE — Progress Notes (Addendum)
Patient had some periods of desaturating during her sleep overnight. Trial off oxygen, saturated sustained at 90-91%. Will maintain 2L via [place] at this time. Will continue to monitor and have day shift perform a trial once patient is up and moving more.

## 2016-06-19 NOTE — Discharge Summary (Signed)
Physician Discharge Summary  Patient ID: Betty Robinson MRN: [id number] DOB/AGE: 01/23/1956 60 y.o.  Admit date: 06/13/2016 Discharge date: 06/14/2016   Procedures:  Procedure(s) (LRB): LEFT TOTAL KNEE ARTHROPLASTY (Left)  Attending Physician:  Dr. Durene Romans   Admission Diagnoses:    Left knee primary OA /pain  Discharge Diagnoses:  Principal Problem:   S/P left TKA Active Problems:   Obese  Past Medical History:  Diagnosis Date  . Arthritis   . Hypertension   . PONV (postoperative nausea and vomiting)    per pt vomited after receiving Dilaudid    HPI:    Betty Robinson, 60 y.o. female, has a history of pain and functional disability in the left knee due to arthritis and has failed non-surgical conservative treatments for greater than 12 weeks to include NSAID's and/or analgesics, corticosteriod injections and activity modification.  Onset of symptoms was gradual, starting 3+ years ago with gradually worsening course since that time. The patient noted prior procedures on the knee to include  arthroscopy on the left knee(s).  Patient currently rates pain in the left knee(s) at 9 out of 10 with activity. Patient has night pain, worsening of pain with activity and weight bearing, pain that interferes with activities of daily living, pain with passive range of motion, crepitus and joint swelling.  Patient has evidence of periarticular osteophytes and joint space narrowing by imaging studies.  There is no active infection.   Risks, benefits and expectations were discussed with the patient.  Risks including but not limited to the risk of anesthesia, blood clots, nerve damage, blood vessel damage, failure of the prosthesis, infection and up to and including death.  Patient understand the risks, benefits and expectations and wishes to proceed with surgery.  PCP: Gaye Alken, MD   Discharged Condition: good  Hospital Course:  Patient underwent the above stated procedure on  06/13/2016. Patient tolerated the procedure well and brought to the recovery room in good condition and subsequently to the floor.  POD #1 BP: 124/69 ; Pulse: 65 ; Temp: 97.7 F (36.5 C) ; Resp: 16 Patient reports pain as mild, pain controlled.  No events throughout the night.  Procedure was discussed.  Ready to be discharged home. Dorsiflexion/plantar flexion intact, incision: dressing C/D/I, no cellulitis present and compartment soft.   LABS  Basename    HGB     10.6  HCT     31.9    Discharge Exam: General appearance: alert, cooperative and no distress Extremities: Homans sign is negative, no sign of DVT, no edema, redness or tenderness in the calves or thighs and no ulcers, gangrene or trophic changes  Disposition: Home with follow up in 2 weeks   Follow-up Information    [name], MD. Schedule an appointment as soon as possible for a visit in 2 week(s).   Specialty:  Orthopedic Surgery Contact information: [address] [place] [address] [phone]        [facility] .   Why:  home health physical therapy Contact information: [address] [place] [address] [phone]           Discharge Instructions    Call MD / Call 911    Complete by:  As directed   If you experience chest pain or shortness of breath, CALL 911 and be transported to the hospital emergency room.  If you develope a fever above 101 F, pus (white drainage) or increased drainage or redness  at the wound, or calf pain, call your surgeon's office.   Change dressing    Complete by:  As directed   Maintain surgical dressing until follow up in the clinic. If the edges start to pull up, may reinforce with tape. If the dressing is no longer working, may remove and cover with gauze and tape, but must keep the area dry and clean.  Call with any questions or concerns.   Constipation Prevention    Complete by:  As directed   Drink plenty of fluids.  Prune  juice may be helpful.  You may use a stool softener, such as Colace (over the counter) 100 mg twice a day.  Use MiraLax (over the counter) for constipation as needed.   Diet - low sodium heart healthy    Complete by:  As directed   Discharge instructions    Complete by:  As directed   Maintain surgical dressing until follow up in the clinic. If the edges start to pull up, may reinforce with tape. If the dressing is no longer working, may remove and cover with gauze and tape, but must keep the area dry and clean.  Follow up in 2 weeks at [facility]. Call with any questions or concerns.   Increase activity slowly as tolerated    Complete by:  As directed   Weight bearing as tolerated with assist device (walker, cane, etc) as directed, use it as long as suggested by your surgeon or therapist, typically at least 4-6 weeks.   TED hose    Complete by:  As directed   Use stockings (TED hose) for 2 weeks on both leg(s).  You may remove them at night for sleeping.        Medication List    TAKE these medications   aspirin 81 MG chewable tablet Chew 1 tablet (81 mg total) by mouth 2 (two) times daily. Take for 4 weeks.   b complex vitamins tablet Take 1 tablet by mouth daily.   calcium carbonate 600 MG Tabs tablet Commonly known as:  OS-CAL Take 1,200 mg by mouth 2 (two) times daily with a meal.   celecoxib 200 MG capsule Commonly known as:  CELEBREX Take 1 capsule (200 mg total) by mouth 2 (two) times daily. What changed:  when to take this   docusate sodium 100 MG capsule Commonly known as:  COLACE Take 1 capsule (100 mg total) by mouth 2 (two) times daily.   ferrous sulfate 325 (65 FE) MG tablet Take 1 tablet (325 mg total) by mouth 3 (three) times daily after meals.   fish oil-omega-3 fatty acids 1000 MG capsule Take 1 g by mouth daily.   HYDROcodone-acetaminophen 7.5-325 MG tablet Commonly known as:  NORCO Take 1-2 tablets by mouth every 4 (four) hours as needed for  moderate pain.   Magnesium 300 MG Caps Take 1 capsule by mouth daily.   methocarbamol 500 MG tablet Commonly known as:  ROBAXIN Take 1 tablet (500 mg total) by mouth every 6 (six) hours as needed for muscle spasms.   multivitamin with minerals Tabs tablet Take 1 tablet by mouth daily.   polyethylene glycol packet Commonly known as:  MIRALAX / GLYCOLAX Take 17 g by mouth 2 (two) times daily.   promethazine 12.5 MG tablet Commonly known as:  PHENERGAN Take 1 tablet (12.5 mg total) by mouth every 6 (six) hours as needed for nausea or vomiting.   quinapril-hydrochlorothiazide 10-12.5 MG tablet Commonly known as:  ACCURETIC  Take 1 tablet by mouth daily before breakfast.        Signed: Anastasio Auerbach. Rueben Kassim   PA-C  06/19/2016, 11:55 AM

## 2017-06-18 ENCOUNTER — Emergency Department (HOSPITAL_COMMUNITY)
Admission: EM | Admit: 2017-06-18 | Discharge: 2017-06-18 | Disposition: A | Discharge status: Home / Self Care | Payer: 59 | Attending: Emergency Medicine | Admitting: Emergency Medicine

## 2017-06-18 ENCOUNTER — Encounter (HOSPITAL_COMMUNITY): Payer: Self-pay | Admitting: Emergency Medicine

## 2017-06-18 DIAGNOSIS — X58XXXA Exposure to other specified factors, initial encounter: Secondary | ICD-10-CM | POA: Insufficient documentation

## 2017-06-18 DIAGNOSIS — M436 Torticollis: Secondary | ICD-10-CM | POA: Diagnosis not present

## 2017-06-18 DIAGNOSIS — Y939 Activity, unspecified: Secondary | ICD-10-CM | POA: Diagnosis not present

## 2017-06-18 DIAGNOSIS — S139XXA Sprain of joints and ligaments of unspecified parts of neck, initial encounter: Secondary | ICD-10-CM | POA: Insufficient documentation

## 2017-06-18 DIAGNOSIS — Y999 Unspecified external cause status: Secondary | ICD-10-CM | POA: Diagnosis not present

## 2017-06-18 DIAGNOSIS — M542 Cervicalgia: Secondary | ICD-10-CM | POA: Diagnosis present

## 2017-06-18 DIAGNOSIS — Y929 Unspecified place or not applicable: Secondary | ICD-10-CM | POA: Insufficient documentation

## 2017-06-18 DIAGNOSIS — I10 Essential (primary) hypertension: Secondary | ICD-10-CM | POA: Diagnosis not present

## 2017-06-18 DIAGNOSIS — Z79899 Other long term (current) drug therapy: Secondary | ICD-10-CM | POA: Insufficient documentation

## 2017-06-18 MED ORDER — DIAZEPAM 2 MG PO TABS
2.0000 mg | ORAL_TABLET | Freq: Once | ORAL | Status: AC
  Administered 2017-06-18: 2 mg via ORAL
  Filled 2017-06-18: qty 1

## 2017-06-18 MED ORDER — DIAZEPAM 5 MG PO TABS
5.0000 mg | ORAL_TABLET | Freq: Once | ORAL | Status: AC
  Administered 2017-06-18: 5 mg via ORAL
  Filled 2017-06-18: qty 1

## 2017-06-18 MED ORDER — KETOROLAC TROMETHAMINE 60 MG/2ML IM SOLN
30.0000 mg | Freq: Once | INTRAMUSCULAR | Status: AC
  Administered 2017-06-18: 30 mg via INTRAMUSCULAR
  Filled 2017-06-18: qty 2

## 2017-06-18 MED ORDER — WHITE PETROLATUM GEL
Status: DC | PRN
  Administered 2017-06-18: 10:00:00 via TOPICAL
  Filled 2017-06-18 (×3): qty 28.35

## 2017-06-18 MED ORDER — DIAZEPAM 5 MG PO TABS: 5.0000 mg | ORAL_TABLET | Freq: Three times a day (TID) | ORAL | 0 refills | Status: AC

## 2017-06-18 NOTE — ED Notes (Signed)
Pt A&Ox4. Ambulatory with steady gait. Pt verbalized understanding of discharge instructions and follow up care. Pt has all belongings with her at d/c.

## 2017-06-18 NOTE — ED Triage Notes (Signed)
Pt in from home with c/o R posterior neck pain, gradually worsening x 1 wk. Pt denies numbness/tingling of extremities, denies sob or fevers. Pt has tried muscle relaxers, heat with no pain relief. Alert, VSS

## 2017-06-18 NOTE — ED Provider Notes (Signed)
[facility]-EMERGENCY DEPT Provider Note   CSN: [id number] Arrival date & time: 06/18/17  0805     History   Chief Complaint Chief Complaint  Patient presents with  . neck pain    HPI Betty Robinson is a 61 y.o. female with a past medical history significant for HTN, cervical fusion, and bilateral knee replacement who present today with right neck pain. Patient report that she has been experiencing neck pain for about a week, but symptoms worsens on Friday. She has been unable to turn her neck to the right without significant pain. Patient was seen at the urgent care this weekend and was prescribed flexeril and Norco for her pain with no relief in her pain. Patient has tried heating pad with no change in her symptoms. Patient denies any headaches, visual changes, chest pain, shortness of breath, abdominal pain.  HPI  Past Medical History:  Diagnosis Date  . Arthritis   . Hypertension   . PONV (postoperative nausea and vomiting)    per pt vomited after receiving Dilaudid    Patient Active Problem List   Diagnosis Date Noted  . S/P left TKA 06/13/2016  . Expected blood loss anemia 10/16/2012  . Obese 10/16/2012  . S/P right TKA 10/15/2012    Past Surgical History:  Procedure Laterality Date  . bilateral knee arthroscopic  R 6 years ago L June 2013  . CERVICAL FUSION  2011   C2 C3 C4  . KNEE ARTHROSCOPY  10/15/2012   Procedure: ARTHROSCOPY KNEE;  Surgeon: Shelda PalMatthew D Olin, MD;  Location: [facility];  Service: Orthopedics;  Laterality: Left;  . Left foot surgery  Dec. 2011  . TOTAL KNEE ARTHROPLASTY  10/15/2012   Procedure: TOTAL KNEE ARTHROPLASTY;  Surgeon: Shelda PalMatthew D Olin, MD;  Location: [facility];  Service: Orthopedics;  Laterality: Right;  . TOTAL KNEE ARTHROPLASTY Left 06/13/2016   Procedure: LEFT TOTAL KNEE ARTHROPLASTY;  Surgeon: Durene RomansMatthew Olin, MD;  Location: [facility];  Service: Orthopedics;  Laterality: Left;    OB History    No data available       Home Medications    Prior to  Admission medications   Medication Sig Start Date End Date Taking? Authorizing Provider  b complex vitamins tablet Take 1 tablet by mouth daily.   Yes [provider]  calcium carbonate (OS-CAL) 600 MG TABS Take 1,200 mg by mouth 2 (two) times daily with a meal.   Yes [provider]  celecoxib (CELEBREX) 200 MG capsule Take 1 capsule (200 mg total) by mouth 2 (two) times daily. Patient taking differently: Take 200 mg by mouth daily.  10/16/12  Yes Babish, Molli HazardMatthew, PA-C  cyclobenzaprine (FLEXERIL) 10 MG tablet Take 10 mg by mouth daily as needed for muscle spasms. 06/17/17  Yes [provider]  fish oil-omega-3 fatty acids 1000 MG capsule Take 1 g by mouth daily.   Yes [provider]  HYDROcodone-acetaminophen (NORCO/VICODIN) 5-325 MG tablet Take 1 tablet by mouth every 6 (six) hours as needed for pain. 06/17/17  Yes [provider]  Magnesium 300 MG CAPS Take 1 capsule by mouth daily.   Yes [provider]  Multiple Vitamin (MULTIVITAMIN WITH MINERALS) TABS Take 1 tablet by mouth daily.   Yes [provider]  ondansetron (ZOFRAN-ODT) 8 MG disintegrating tablet Take 8 mg by mouth every 8 (eight) hours as needed for nausea. 06/17/17  Yes [provider]  quinapril-hydrochlorothiazide (ACCURETIC) 10-12.5 MG per tablet Take 1 tablet by mouth daily before  breakfast.   Yes [provider]  docusate sodium (COLACE) 100 MG capsule Take 1 capsule (100 mg total) by mouth 2 (two) times daily. Patient not taking: Reported on 06/18/2017 06/13/16   Lanney Gins, PA-C  ferrous sulfate 325 (65 FE) MG tablet Take 1 tablet (325 mg total) by mouth 3 (three) times daily after meals. Patient not taking: Reported on 06/18/2017 06/13/16   Lanney Gins, PA-C  HYDROcodone-acetaminophen (NORCO) 7.5-325 MG tablet Take 1-2 tablets by mouth every 4 (four) hours as needed for moderate pain. Patient not taking: Reported on 06/18/2017 06/13/16    Lanney Gins, PA-C  methocarbamol (ROBAXIN) 500 MG tablet Take 1 tablet (500 mg total) by mouth every 6 (six) hours as needed for muscle spasms. Patient not taking: Reported on 06/18/2017 06/13/16   Lanney Gins, PA-C  polyethylene glycol (MIRALAX / GLYCOLAX) packet Take 17 g by mouth 2 (two) times daily. Patient not taking: Reported on 06/18/2017 06/13/16   Lanney Gins, PA-C  promethazine (PHENERGAN) 12.5 MG tablet Take 1 tablet (12.5 mg total) by mouth every 6 (six) hours as needed for nausea or vomiting. Patient not taking: Reported on 06/18/2017 06/14/16   Lanney Gins, PA-C    Family History No family history on file.  Social History Social History  Substance Use Topics  . Smoking status: Never Smoker  . Smokeless tobacco: Never Used  . Alcohol use No     Comment: rare     Allergies   Patient has no known allergies.   Review of Systems Review of Systems  Constitutional: Negative.   HENT: Negative.   Eyes: Negative.   Respiratory: Negative.   Cardiovascular: Negative.   Gastrointestinal: Negative.   Endocrine: Negative.   Genitourinary: Negative.   Musculoskeletal: Positive for neck pain and neck stiffness.  Skin: Negative.   Allergic/Immunologic: Negative.   Neurological: Negative.   Hematological: Negative.   Psychiatric/Behavioral: Negative.      Physical Exam Updated Vital Signs BP 121/73   Pulse 77   Temp 98.9 F (37.2 C) (Oral)   Resp 16   Ht 5\' 1"  (1.549 m)   Wt 99.8 kg (220 lb)   SpO2 92%   BMI 41.57 kg/m   Physical Exam  Constitutional: She is oriented to person, place, and time. She appears well-developed.  HENT:  Head: Normocephalic and atraumatic.  Right sided tenderness on palpation in the trapezius distribution. No radiation to shoulder. Limited ROM  turning to the right. Patient endorses significant pain with extension and no pain with flexion. ROM of motion on the left side is intact.  Eyes: Pupils are equal, round, and  reactive to light. Conjunctivae and EOM are normal.  Neck: Normal range of motion. Neck supple.  Cardiovascular: Normal rate, regular rhythm and normal heart sounds.   Pulmonary/Chest: Effort normal and breath sounds normal.  Abdominal: Soft. Bowel sounds are normal.  Musculoskeletal: Normal range of motion.  Neurological: She is alert and oriented to person, place, and time.  Skin: Skin is warm and dry.  Psychiatric: She has a normal mood and affect. Her behavior is normal.     ED Treatments / Results  Labs (all labs ordered are listed, but only abnormal results are displayed) Labs Reviewed - No data to display  EKG  EKG Interpretation None       Radiology No results found.  Procedures Procedures (including critical care time)  Medications Ordered in ED Medications  white petrolatum (VASELINE) gel ( Topical Given 06/18/17 1018)  diazepam (VALIUM)  tablet 5 mg (not administered)  ketorolac (TORADOL) injection 30 mg (30 mg Intramuscular Given 06/18/17 0852)  diazepam (VALIUM) tablet 2 mg (2 mg Oral Given 06/18/17 [id number])     Initial Impression / Assessment and Plan / ED Course  I have reviewed the triage vital signs and the nursing notes.  Pertinent labs & imaging results that were available during my care of the patient were reviewed by me and considered in my medical decision making (see chart for details).   Patient is a 61 yo female who present with right neck sprain for the past week worsening this past Friday. Patient has failed outpatient therapy with Flexeril and Norco. Patient has a history of cervical fusion surgery. Neuro exam is within normal limit. Symptoms consistent with torticollis vs cervical neck sprain. In the ED patient received toradol and valium (x2) with significant  improvement in her symptoms. Patient was discharged home on valium with close follow up with PCP. Patient expresses understanding and is in agreement with plan.  Final Clinical Impressions(s)  / ED Diagnoses   Final diagnoses:  Neck sprain, initial encounter  Torticollis    New Prescriptions New Prescriptions   No medications on file     Lovena Neighboursiallo, Taliyah Watrous, MD 06/18/17 1153    Jacalyn LefevreHaviland, Julie, MD 06/18/17 1344

## 2017-08-28 ENCOUNTER — Encounter: Payer: Self-pay | Admitting: Dietician

## 2017-08-28 ENCOUNTER — Encounter: Payer: 59 | Attending: Family Medicine | Admitting: Dietician

## 2017-08-28 DIAGNOSIS — E119 Type 2 diabetes mellitus without complications: Secondary | ICD-10-CM | POA: Insufficient documentation

## 2017-08-28 DIAGNOSIS — Z713 Dietary counseling and surveillance: Secondary | ICD-10-CM | POA: Insufficient documentation

## 2017-08-28 NOTE — Patient Instructions (Signed)
Continue to be mindful. Eat slowly and stop when satisfied. Great job on the changes that you have made! Continue to be sure to drink enough fluid. Increase the non-starchy vegetables with meals (fiber can help you feel more full). Small amounts of protein with each meal or starch. Consider pedometer app on your phone.   Aim for 2 Carb Choices per meal (30 grams) +/- 1 either way  Aim for 0-1 Carbs per snack if hungry  Consider reading food labels for Total Carbohydrate and Fat Grams of foods Consider  increasing your activity level by walking for 15 minutes daily as tolerated Consider checking BG at alternate times per day as directed by MD   Meter your insurance likes

## 2017-08-28 NOTE — Progress Notes (Signed)
Diabetes Self-Management Education  Visit Type: First/Initial  Appt. Start Time: 1645 Appt. End Time: 1745  08/28/2017  Betty Robinson, identified by name and date of birth, is a 61 y.o. female with a diagnosis of Diabetes: Type 2.  Other hx includes HTN.  Last year her A1C was 6.1%.  Increased to 8.4$ 06/2017.   Patient reports recent weight loss of 10 lbs.  She has decreased her carbohydrate intake and is more mindful about her food choices.  4-5 years ago she lost 50 lbs on the [employer] weight loss program but regained 40 lbs.  She states that she had not been careful about her eating habits especially earlier this year.  Patient lives with her husband and she does the shopping and cooking.  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  ASSESSMENT  Height  (1.549 m), weight 214 lb (97.1 kg). Body mass index is 40.43 kg/m.      Diabetes Self-Management Education - 08/28/17 1650      Visit Information   Visit Type First/Initial     Initial Visit   Diabetes Type Type 2   Are you currently following a meal plan? No   Are you taking your medications as prescribed? Not on Medications   Date Diagnosed 06/2017     Health Coping   How would you rate your overall health? Good     Psychosocial Assessment   Patient Belief/Attitude about Diabetes Motivated to manage diabetes   Self-care barriers None   Self-management support Doctor's office   Other persons present Patient   Patient Concerns Nutrition/Meal planning;Glycemic Control;Weight Control   Special Needs None   Preferred Learning Style No preference indicated   Learning Readiness Ready   How often do you need to have someone help you when you read instructions, pamphlets, or other written materials from your doctor or pharmacy? 1 - Never   What is the last grade level you completed in school? 4 years college     Pre-Education Assessment   Patient understands the diabetes disease and treatment process. Needs  Instruction   Patient understands incorporating nutritional management into lifestyle. Needs Instruction   Patient undertands incorporating physical activity into lifestyle. Needs Instruction   Patient understands using medications safely. Needs Instruction   Patient understands monitoring blood glucose, interpreting and using results Needs Instruction   Patient understands prevention, detection, and treatment of acute complications. Needs Instruction   Patient understands prevention, detection, and treatment of chronic complications. Needs Instruction   Patient understands how to develop strategies to address psychosocial issues. Needs Instruction   Patient understands how to develop strategies to promote health/change behavior. Needs Instruction     Complications   Last HgB A1C per patient/outside source 8.4 %  06/2017   How often do you check your blood sugar? --  once per week   Fasting Blood glucose range (mg/dL) 914-782   Number of hypoglycemic episodes per month 0   Number of hyperglycemic episodes per week 14   Can you tell when your blood sugar is high? No   Have you had a dilated eye exam in the past 12 months? Yes   Have you had a dental exam in the past 12 months? Yes   Are you checking your feet? Yes   How many days per week are you checking your feet? 7     Dietary Intake   Breakfast scrambled egg, 2 strips bacon, 1 slice lite toast dry, coffee  with flavored creamer   Snack (morning) 1/4 cup nuts   Lunch salad, [place], cheese, boiled egg, 2 oz regular dressing, fruit cup OR leftovers   Snack (afternoon) none   Dinner salad, grilled pork chop   Snack (evening) occasional sugar free jello with whipped topping OR pork rinds   Beverage(s) water, unsweetened tea, coffee with flavored creamer     Exercise   Exercise Type ADL's  14 dogs "show dogs"   How many days per week to you exercise? 0   How many minutes per day do you exercise? 0   Total minutes per week of exercise  0     Patient Education   Previous Diabetes Education No   Disease state  Definition of diabetes, type 1 and 2, and the diagnosis of diabetes   Nutrition management  Role of diet in the treatment of diabetes and the relationship between the three main macronutrients and blood glucose level;Food label reading, portion sizes and measuring food.;Meal options for control of blood glucose level and chronic complications.;Reviewed blood glucose goals for pre and post meals and how to evaluate the patients' food intake on their blood glucose level.   Physical activity and exercise  Role of exercise on diabetes management, blood pressure control and cardiac health.;Helped patient identify appropriate exercises in relation to his/her diabetes, diabetes complications and other health issue.   Chronic complications Relationship between chronic complications and blood glucose control;Assessed and discussed foot care and prevention of foot problems;Retinopathy and reason for yearly dilated eye exams;Dental care   Psychosocial adjustment Worked with patient to identify barriers to care and solutions;Role of stress on diabetes;Identified and addressed patients feelings and concerns about diabetes     Individualized Goals (developed by patient)   Nutrition General guidelines for healthy choices and portions discussed   Physical Activity 15 minutes per day;Exercise 3-5 times per week   Medications Not Applicable   Monitoring  test my blood glucose as discussed   Reducing Risk examine blood glucose patterns   Health Coping discuss diabetes with (comment)  MD/RD     Post-Education Assessment   Patient understands the diabetes disease and treatment process. Demonstrates understanding / competency   Patient understands incorporating nutritional management into lifestyle. Demonstrates understanding / competency   Patient undertands incorporating physical activity into lifestyle. Demonstrates understanding /  competency   Patient understands using medications safely. Demonstrates understanding / competency   Patient understands monitoring blood glucose, interpreting and using results Demonstrates understanding / competency   Patient understands prevention, detection, and treatment of acute complications. Demonstrates understanding / competency   Patient understands prevention, detection, and treatment of chronic complications. Demonstrates understanding / competency   Patient understands how to develop strategies to address psychosocial issues. Demonstrates understanding / competency   Patient understands how to develop strategies to promote health/change behavior. Demonstrates understanding / competency     Outcomes   Expected Outcomes Demonstrated interest in learning. Expect positive outcomes   Future DMSE PRN   Program Status Completed      Individualized Plan for Diabetes Self-Management Training:   Learning Objective:  Patient will have a greater understanding of diabetes self-management. Patient education plan is to attend individual and/or group sessions per assessed needs and concerns.   Plan:   Patient Instructions  Continue to be mindful. Eat slowly and stop when satisfied. Great job on the changes that you have made! Continue to be sure to drink enough fluid. Increase the non-starchy vegetables with meals (fiber can help  you feel more full). Small amounts of protein with each meal or starch. Consider pedometer app on your phone.   Aim for 2 Carb Choices per meal (30 grams) +/- 1 either way  Aim for 0-1 Carbs per snack if hungry  Consider reading food labels for Total Carbohydrate and Fat Grams of foods Consider  increasing your activity level by walking for 15 minutes daily as tolerated Consider checking BG at alternate times per day as directed by MD   Meter your insurance likes         Expected Outcomes:  Demonstrated interest in learning. Expect positive  outcomes  Education material provided: Living Well with Diabetes, Food label handouts, A1C conversion sheet, Meal plan card, My Plate and Snack sheet  If problems or questions, patient to contact team via:  Phone  Future DSME appointment: PRN

## 2019-07-18 ENCOUNTER — Other Ambulatory Visit: Payer: Self-pay | Admitting: Physician Assistant

## 2019-07-18 DIAGNOSIS — H5509 Other forms of nystagmus: Secondary | ICD-10-CM

## 2019-07-24 ENCOUNTER — Other Ambulatory Visit: Payer: Self-pay

## 2019-07-24 ENCOUNTER — Ambulatory Visit
Admission: RE | Admit: 2019-07-24 | Discharge: 2019-07-24 | Disposition: A | Discharge status: Home / Self Care | Payer: 59 | Source: Ambulatory Visit | Attending: Physician Assistant | Admitting: Physician Assistant

## 2019-07-24 DIAGNOSIS — H8190 Unspecified disorder of vestibular function, unspecified ear: Secondary | ICD-10-CM

## 2019-07-24 DIAGNOSIS — H5509 Other forms of nystagmus: Secondary | ICD-10-CM

## 2019-07-24 MED ORDER — GADOBENATE DIMEGLUMINE 529 MG/ML IV SOLN
20.0000 mL | Freq: Once | INTRAVENOUS | Status: AC | PRN
  Administered 2019-07-24: 20 mL via INTRAVENOUS

## 2019-07-30 ENCOUNTER — Other Ambulatory Visit: Payer: 59

## 2019-07-31 ENCOUNTER — Encounter: Payer: Self-pay | Admitting: *Deleted

## 2019-07-31 ENCOUNTER — Other Ambulatory Visit: Payer: Self-pay | Admitting: *Deleted

## 2019-08-05 ENCOUNTER — Other Ambulatory Visit: Payer: Self-pay

## 2019-08-05 ENCOUNTER — Ambulatory Visit (INDEPENDENT_AMBULATORY_CARE_PROVIDER_SITE_OTHER): Payer: Managed Care, Other (non HMO) | Admitting: Diagnostic Neuroimaging

## 2019-08-05 ENCOUNTER — Encounter: Payer: Self-pay | Admitting: Diagnostic Neuroimaging

## 2019-08-05 VITALS — BP 142/99 | HR 82 | Temp 97.7°F | Ht 61.0 in | Wt 210.2 lb

## 2019-08-05 DIAGNOSIS — G43109 Migraine with aura, not intractable, without status migrainosus: Secondary | ICD-10-CM

## 2019-08-05 DIAGNOSIS — H8192 Unspecified disorder of vestibular function, left ear: Secondary | ICD-10-CM

## 2019-08-05 NOTE — Progress Notes (Signed)
[facility]  PATIENT: Betty Robinson DOB: [birth year]  REFERRING CLINICIAN: Elbert EwingsL Clovis CaoNordbladh HISTORY FROM: patient  REASON FOR VISIT: new consult    HISTORICAL  CHIEF COMPLAINT:  Chief Complaint  Patient presents with  . Headache    rm 6 New Pt, "vertigo, dizziness, been to [facility]; MRI brain done at GI"    HISTORY OF PRESENT ILLNESS:   63 year old female here for evaluation of dizziness and vertigo.  2010 patient had 1 day episode of room spinning vertigo with nausea and vomiting.  Symptoms were aggravated with change in position and spontaneously resolved.  2013 patient had another similar 1 day episode of room spinning vertigo with nausea vomiting.  Symptoms spontaneously resolved.  05/24/2019 patient had severe onset of room spinning vertigo with nausea and vomiting.  This occurred once patient woke up.  Symptoms aggravated when she turns or moves her head.  Room seem to spin towards the left side.  Patient went to ENT and audiology for evaluation.  She was diagnosed with probable benign positional vertigo and treated with canalith repositioning exercises.  Symptoms did not improve initially and therefore patient was referred for MRI of the brain which showed no specific findings.  Eventually vertigo symptoms have resolved.  Now patient having some milder nonspecific swimmy headedness, dizziness, balance issues when walking.  Patient has some intermittent headaches, sometimes on the left side with throbbing sensation.  No nausea or vomiting.  No sensitivity light or sound.  Patient has had history of migraine headaches in the past with visual aura, nausea, vomiting, sensitivity to light and sound.  Patient has had history of "white spots in the brain" initially thought to be MS but then saw neurology who ruled this out.  Patient has had neck problems in the past, status post cervical discectomy and fusion in 2010.  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   REVIEW OF SYSTEMS: Full 14 system review of systems performed and negative with exception of: As per HPI.  ALLERGIES: No Known Allergies  HOME MEDICATIONS: Outpatient Medications Prior to Visit  Medication Sig Dispense Refill  . b complex vitamins tablet Take 1 tablet by mouth daily.    . calcium carbonate (OS-CAL) 600 MG TABS Take 1,200 mg by mouth 2 (two) times daily with a meal.    . celecoxib (CELEBREX) 200 MG capsule 200 mg 2 (two) times daily.    . fish oil-omega-3 fatty acids 1000 MG capsule Take 1 g by mouth daily.    Marland Kitchen. loratadine (CLARITIN) 10 MG tablet Take 10 mg by mouth daily.    . Magnesium 300 MG CAPS Take 1 capsule by mouth daily.    . metFORMIN (GLUCOPHAGE) 500 MG tablet     . Multiple Vitamin (MULTIVITAMIN WITH MINERALS) TABS Take 1 tablet by mouth daily.    . ondansetron (ZOFRAN-ODT) 8 MG disintegrating tablet Take 8 mg by mouth every 8 (eight) hours as needed for nausea.    . quinapril-hydrochlorothiazide (ACCURETIC) 10-12.5 MG per tablet Take 1 tablet by mouth daily before breakfast.    . celecoxib (CELEBREX) 200 MG capsule Take 1 capsule (200 mg total) by mouth 2 (two) times daily. (Patient taking differently: Take 200 mg by mouth daily. )    . cyclobenzaprine (FLEXERIL) 10 MG tablet Take 10 mg by mouth daily as needed for muscle spasms.    Marland Kitchen. docusate sodium (COLACE) 100 MG capsule Take 1 capsule (100 mg total) by mouth  2 (two) times daily. (Patient not taking: Reported on 06/18/2017) 10 capsule 0  . ferrous sulfate 325 (65 FE) MG tablet Take 1 tablet (325 mg total) by mouth 3 (three) times daily after meals. (Patient not taking: Reported on 06/18/2017)  3  . HYDROcodone-acetaminophen (NORCO) 7.5-325 MG tablet Take 1-2 tablets by mouth every 4 (four) hours as needed for moderate pain. (Patient not taking: Reported on 06/18/2017) 100 tablet 0  . HYDROcodone-acetaminophen (NORCO/VICODIN) 5-325 MG tablet Take 1 tablet by mouth every 6  (six) hours as needed for pain.    . meclizine (ANTIVERT) 12.5 MG tablet 1 TABLET 3 TIMES A DAY ONLY AS NEEDED FOR VERTIGO ORALLY    . methocarbamol (ROBAXIN) 500 MG tablet Take 1 tablet (500 mg total) by mouth every 6 (six) hours as needed for muscle spasms. (Patient not taking: Reported on 06/18/2017) 40 tablet 0  . polyethylene glycol (MIRALAX / GLYCOLAX) packet Take 17 g by mouth 2 (two) times daily. (Patient not taking: Reported on 06/18/2017) 14 each 0  . promethazine (PHENERGAN) 12.5 MG tablet Take 1 tablet (12.5 mg total) by mouth every 6 (six) hours as needed for nausea or vomiting. (Patient not taking: Reported on 06/18/2017) 30 tablet 0   No facility-administered medications prior to visit.     PAST MEDICAL HISTORY: Past Medical History:  Diagnosis Date  . Arthritis   . Diabetes mellitus without complication ([place])   . Hypertension   . Nystagmus   . PONV (postoperative nausea and vomiting)    per pt vomited after receiving Dilaudid  . Vertigo     PAST SURGICAL HISTORY: Past Surgical History:  Procedure Laterality Date  . ADENOIDECTOMY    . bilateral knee arthroscopic  R 6 years ago L June 2013  . CERVICAL FUSION  2011   C2 C3 C4  . KNEE ARTHROSCOPY  10/15/2012   Procedure: ARTHROSCOPY KNEE;  Surgeon: Mauri Pole, MD;  Location: [facility];  Service: Orthopedics;  Laterality: Left;  . Left foot surgery  Dec. 2011  . TONSILLECTOMY    . TOTAL KNEE ARTHROPLASTY  10/15/2012   Procedure: TOTAL KNEE ARTHROPLASTY;  Surgeon: Mauri Pole, MD;  Location: [facility];  Service: Orthopedics;  Laterality: Right;  . TOTAL KNEE ARTHROPLASTY Left 06/13/2016   Procedure: LEFT TOTAL KNEE ARTHROPLASTY;  Surgeon: Paralee Cancel, MD;  Location: [facility];  Service: Orthopedics;  Laterality: Left;    FAMILY HISTORY: Family History  Problem Relation Age of Onset  . Heart disease Father   . Cancer Brother     SOCIAL HISTORY: Social History   Socioeconomic History  . Marital status: Married     Spouse name: Gwyndolyn Saxon  . Number of children: 0  . Years of education: Not on file  . Highest education level: Bachelor's degree (e.g., BA, AB, BS)  Occupational History    Comment: supervisor  Social Needs  . Financial resource strain: Not on file  . Food insecurity    Worry: Not on file    Inability: Not on file  . Transportation needs    Medical: Not on file    Non-medical: Not on file  Tobacco Use  . Smoking status: Never Smoker  . Smokeless tobacco: Never Used  Substance and Sexual Activity  . Alcohol use: No    Comment: rare  . Drug use: No  . Sexual activity: Not on file  Lifestyle  . Physical activity    Days per week: Not on file    Minutes  per session: Not on file  . Stress: Not on file  Relationships  . Social Musician on phone: Not on file    Gets together: Not on file    Attends religious service: Not on file    Active member of club or organization: Not on file    Attends meetings of clubs or organizations: Not on file    Relationship status: Not on file  . Intimate partner violence    Fear of current or ex partner: Not on file    Emotionally abused: Not on file    Physically abused: Not on file    Forced sexual activity: Not on file  Other Topics Concern  . Not on file  Social History Narrative   Lives with husband   Caffeine- coffee 1 cup daily     PHYSICAL EXAM  GENERAL EXAM/CONSTITUTIONAL: Vitals:  Vitals:   08/05/19 0849  BP: (!) 142/99  Pulse: 82  Temp: 97.7 F (36.5 C)  Weight: 210 lb 3.2 oz (95.3 kg)  Height: 5\' 1"  (1.549 m)     Body mass index is 39.72 kg/m. Wt Readings from Last 3 Encounters:  08/05/19 210 lb 3.2 oz (95.3 kg)  08/28/17 214 lb (97.1 kg)  06/18/17 220 lb (99.8 kg)     Patient is in no distress; well developed, nourished and groomed; neck is supple  CARDIOVASCULAR:  Examination of carotid arteries is normal; no carotid bruits  Regular rate and rhythm, no murmurs  Examination of peripheral  vascular system by observation and palpation is normal  EYES:  Ophthalmoscopic exam of optic discs and posterior segments is normal; no papilledema or hemorrhages  No exam data present  MUSCULOSKELETAL:  Gait, strength, tone, movements noted in Neurologic exam below  NEUROLOGIC: MENTAL STATUS:  No flowsheet data found.  awake, alert, oriented to person, place and time  recent and remote memory intact  normal attention and concentration  language fluent, comprehension intact, naming intact  fund of knowledge appropriate  CRANIAL NERVE:   2nd - no papilledema on fundoscopic exam  2nd, 3rd, 4th, 6th - pupils equal and reactive to light, visual fields full to confrontation, extraocular muscles intact, no nystagmus  5th - facial sensation symmetric  7th - facial strength symmetric  8th - hearing intact  9th - palate elevates symmetrically, uvula midline  11th - shoulder shrug symmetric  12th - tongue protrusion midline  MOTOR:   normal bulk and tone, full strength in the BUE, BLE  SENSORY:   normal and symmetric to light touch, temperature, vibration  COORDINATION:   finger-nose-finger, fine finger movements normal  REFLEXES:   deep tendon reflexes present and symmetric  GAIT/STATION:   narrow based gait; SLIGHT DIFF WITH TURNING; SLIGHT DIFF WITH TANDEM     DIAGNOSTIC DATA (LABS, IMAGING, TESTING) - I reviewed patient records, labs, notes, testing and imaging myself where available.  Lab Results  Component Value Date   WBC 14.0 (H) 06/14/2016   HGB 10.6 (L) 06/14/2016   HCT 31.9 (L) 06/14/2016   MCV 97.0 06/14/2016   PLT 290 06/14/2016      Component Value Date/Time   NA 138 06/14/2016 0413   K 4.9 06/14/2016 0413   CL 106 06/14/2016 0413   CO2 26 06/14/2016 0413   GLUCOSE 134 (H) 06/14/2016 0413   BUN 17 06/14/2016 0413   CREATININE 0.59 06/14/2016 0413   CALCIUM 8.2 (L) 06/14/2016 0413   GFRNONAA >60 06/14/2016 0413   GFRAA >  60  06/14/2016 0413   No results found for: CHOL, HDL, LDLCALC, LDLDIRECT, TRIG, CHOLHDL No results found for: ZOXW9UHGBA1C No results found for: VITAMINB12 No results found for: TSH   10/09/12 MRI cervical spine  - Satisfactory appearance of the fusion segment from C3-C6.  No ongoing compression.  Myelomalacia affecting the cord in the C4-C5 region. - Degenerative spondylosis at C6-7 with mild narrowing of the subarachnoid space but no compressive narrowing of the canal or foramina.  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  07/25/19 MRI brain and IAC [I reviewed images myself and agree with interpretation. -VRP]  - No cerebellopontine angle mass or internal auditory canal lesion demonstrated. - No evidence of acute intracranial abnormality. - Mild scattered signal changes within the cerebral white matter are nonspecific, but consistent with chronic small vessel ischemic disease.    ASSESSMENT AND PLAN  63 y.o. year old female here with positional vertigo since 2010, occurring in 2013 and 2020.  Most likely represents benign positional vertigo.  Symptoms of vertigo have finally resolved.  Now with nonspecific dizziness and balance issues which may be multifactorial related to prior cervical spinal cord myelomalacia, peripheral vertigo, migraine.   Meds tried: meclizine  Dx:  1. Peripheral vestibulopathy of left ear   2. Migraine with aura and without status migrainosus, not intractable     PLAN:  PERIPHERAL VESTIBULOPATHY (likely BPPV; improving) - monitor; continue treatments per audiology / ENT  MIGRAINE WITH AURA - monitor; may consider topiramate  Return follow up with ENT, for return to PCP, pending if symptoms worsen or fail to improve.    Suanne MarkerVIKRAM R. Ranyah Groeneveld, MD 08/05/2019, 9:25 AM Certified in Neurology, Neurophysiology and Neuroimaging  [facility] [address], [address] [place], [place] [id number] [phone]

## 2020-08-16 ENCOUNTER — Other Ambulatory Visit: Payer: Self-pay | Admitting: Family Medicine

## 2020-08-16 DIAGNOSIS — R0989 Other specified symptoms and signs involving the circulatory and respiratory systems: Secondary | ICD-10-CM

## 2020-08-19 ENCOUNTER — Ambulatory Visit
Admission: RE | Admit: 2020-08-19 | Discharge: 2020-08-19 | Disposition: A | Discharge status: Home / Self Care | Payer: Managed Care, Other (non HMO) | Source: Ambulatory Visit | Attending: Family Medicine | Admitting: Family Medicine

## 2020-08-19 DIAGNOSIS — R0989 Other specified symptoms and signs involving the circulatory and respiratory systems: Secondary | ICD-10-CM

## 2021-05-13 DIAGNOSIS — H5213 Myopia, bilateral: Secondary | ICD-10-CM | POA: Diagnosis not present

## 2021-07-01 DIAGNOSIS — E7800 Pure hypercholesterolemia, unspecified: Secondary | ICD-10-CM | POA: Diagnosis not present

## 2021-07-01 DIAGNOSIS — E1165 Type 2 diabetes mellitus with hyperglycemia: Secondary | ICD-10-CM | POA: Diagnosis not present

## 2021-07-01 DIAGNOSIS — I10 Essential (primary) hypertension: Secondary | ICD-10-CM | POA: Diagnosis not present

## 2021-07-01 DIAGNOSIS — U071 COVID-19: Secondary | ICD-10-CM | POA: Diagnosis not present

## 2021-09-13 DIAGNOSIS — E1165 Type 2 diabetes mellitus with hyperglycemia: Secondary | ICD-10-CM | POA: Diagnosis not present

## 2021-09-13 DIAGNOSIS — Z23 Encounter for immunization: Secondary | ICD-10-CM | POA: Diagnosis not present

## 2021-09-13 DIAGNOSIS — I10 Essential (primary) hypertension: Secondary | ICD-10-CM | POA: Diagnosis not present

## 2022-05-02 DIAGNOSIS — H16002 Unspecified corneal ulcer, left eye: Secondary | ICD-10-CM | POA: Diagnosis not present

## 2022-05-05 DIAGNOSIS — H16002 Unspecified corneal ulcer, left eye: Secondary | ICD-10-CM | POA: Diagnosis not present

## 2022-05-15 DIAGNOSIS — H16002 Unspecified corneal ulcer, left eye: Secondary | ICD-10-CM | POA: Diagnosis not present

## 2022-05-22 DIAGNOSIS — H5213 Myopia, bilateral: Secondary | ICD-10-CM | POA: Diagnosis not present

## 2022-05-26 ENCOUNTER — Other Ambulatory Visit: Payer: Self-pay

## 2022-08-17 DIAGNOSIS — E1165 Type 2 diabetes mellitus with hyperglycemia: Secondary | ICD-10-CM | POA: Diagnosis not present

## 2022-08-17 DIAGNOSIS — Z23 Encounter for immunization: Secondary | ICD-10-CM | POA: Diagnosis not present

## 2022-08-17 DIAGNOSIS — F419 Anxiety disorder, unspecified: Secondary | ICD-10-CM | POA: Diagnosis not present

## 2022-08-17 DIAGNOSIS — I10 Essential (primary) hypertension: Secondary | ICD-10-CM | POA: Diagnosis not present

## 2022-08-23 DIAGNOSIS — I10 Essential (primary) hypertension: Secondary | ICD-10-CM | POA: Diagnosis not present

## 2022-08-23 DIAGNOSIS — E1165 Type 2 diabetes mellitus with hyperglycemia: Secondary | ICD-10-CM | POA: Diagnosis not present

## 2022-11-16 DIAGNOSIS — Z96651 Presence of right artificial knee joint: Secondary | ICD-10-CM | POA: Diagnosis not present

## 2022-11-16 DIAGNOSIS — Z96652 Presence of left artificial knee joint: Secondary | ICD-10-CM | POA: Diagnosis not present

## 2022-11-16 DIAGNOSIS — M25562 Pain in left knee: Secondary | ICD-10-CM | POA: Diagnosis not present

## 2022-11-16 DIAGNOSIS — M25561 Pain in right knee: Secondary | ICD-10-CM | POA: Diagnosis not present

## 2022-12-22 DIAGNOSIS — I10 Essential (primary) hypertension: Secondary | ICD-10-CM | POA: Diagnosis not present

## 2022-12-22 DIAGNOSIS — F419 Anxiety disorder, unspecified: Secondary | ICD-10-CM | POA: Diagnosis not present

## 2022-12-22 DIAGNOSIS — E1165 Type 2 diabetes mellitus with hyperglycemia: Secondary | ICD-10-CM | POA: Diagnosis not present

## 2022-12-22 DIAGNOSIS — E7800 Pure hypercholesterolemia, unspecified: Secondary | ICD-10-CM | POA: Diagnosis not present

## 2023-04-06 DIAGNOSIS — Z6837 Body mass index (BMI) 37.0-37.9, adult: Secondary | ICD-10-CM | POA: Diagnosis not present

## 2023-04-06 DIAGNOSIS — U071 COVID-19: Secondary | ICD-10-CM | POA: Diagnosis not present

## 2023-04-17 DIAGNOSIS — E7800 Pure hypercholesterolemia, unspecified: Secondary | ICD-10-CM | POA: Diagnosis not present

## 2023-04-17 DIAGNOSIS — F419 Anxiety disorder, unspecified: Secondary | ICD-10-CM | POA: Diagnosis not present

## 2023-04-17 DIAGNOSIS — E1165 Type 2 diabetes mellitus with hyperglycemia: Secondary | ICD-10-CM | POA: Diagnosis not present

## 2023-05-15 ENCOUNTER — Telehealth: Payer: Self-pay

## 2023-05-15 DIAGNOSIS — E1165 Type 2 diabetes mellitus with hyperglycemia: Secondary | ICD-10-CM

## 2023-05-15 NOTE — Telephone Encounter (Signed)
Ivorie Uplinger [facility], CMA  ?

## 2023-06-26 ENCOUNTER — Other Ambulatory Visit (INDEPENDENT_AMBULATORY_CARE_PROVIDER_SITE_OTHER): Payer: Medicare Other

## 2023-06-26 DIAGNOSIS — E1165 Type 2 diabetes mellitus with hyperglycemia: Secondary | ICD-10-CM | POA: Diagnosis not present

## 2023-06-26 LAB — COMPREHENSIVE METABOLIC PANEL
ALT: 14 U/L (ref 0–35)
AST: 13 U/L (ref 0–37)
Albumin: 4 g/dL (ref 3.5–5.2)
Alkaline Phosphatase: 70 U/L (ref 39–117)
BUN: 16 mg/dL (ref 6–23)
CO2: 29 mEq/L (ref 19–32)
Calcium: 9.9 mg/dL (ref 8.4–10.5)
Chloride: 98 mEq/L (ref 96–112)
Creatinine, Ser: 0.72 mg/dL (ref 0.40–1.20)
GFR: 86.59 mL/min (ref >60.00)
Glucose, Bld: 305 mg/dL — ABNORMAL HIGH (ref 70–99)
Potassium: 4.4 mEq/L (ref 3.5–5.1)
Sodium: 137 mEq/L (ref 135–145)
Total Bilirubin: 0.3 mg/dL (ref 0.2–1.2)
Total Protein: 6.6 g/dL (ref 6.0–8.3)

## 2023-06-26 LAB — LIPID PANEL
Cholesterol: 113 mg/dL (ref 0–200)
HDL: 43 mg/dL (ref >39.00)
LDL Cholesterol: 34 mg/dL (ref 0–99)
NonHDL: 70.3
Total CHOL/HDL Ratio: 3
Triglycerides: 182 mg/dL — ABNORMAL HIGH (ref 0.0–149.0)
VLDL: 36.4 mg/dL (ref 0.0–40.0)

## 2023-06-26 LAB — HEMOGLOBIN A1C: Hgb A1c MFr Bld: 10.4 % — ABNORMAL HIGH (ref 4.6–6.5)

## 2023-06-28 ENCOUNTER — Ambulatory Visit: Payer: Medicare Other | Admitting: "Endocrinology

## 2023-07-02 ENCOUNTER — Encounter: Payer: Self-pay | Admitting: "Endocrinology

## 2023-07-02 ENCOUNTER — Ambulatory Visit (INDEPENDENT_AMBULATORY_CARE_PROVIDER_SITE_OTHER): Payer: Medicare Other | Admitting: "Endocrinology

## 2023-07-02 VITALS — BP 140/85 | HR 96 | Ht 61.0 in | Wt 191.2 lb

## 2023-07-02 DIAGNOSIS — Z7984 Long term (current) use of oral hypoglycemic drugs: Secondary | ICD-10-CM

## 2023-07-02 DIAGNOSIS — Z7985 Long-term (current) use of injectable non-insulin antidiabetic drugs: Secondary | ICD-10-CM

## 2023-07-02 DIAGNOSIS — E1165 Type 2 diabetes mellitus with hyperglycemia: Secondary | ICD-10-CM

## 2023-07-02 DIAGNOSIS — E782 Mixed hyperlipidemia: Secondary | ICD-10-CM

## 2023-07-02 MED ORDER — LANTUS SOLOSTAR 100 UNIT/ML ~~LOC~~ SOPN: 50.0000 [IU] | PEN_INJECTOR | Freq: Every day | SUBCUTANEOUS | 1 refills | Status: DC

## 2023-07-02 NOTE — Progress Notes (Signed)
Outpatient Endocrinology Note Betty Leonard, MD  07/04/23   Betty Robinson Dec 08, 1955 [id number]  Referring Provider: No ref. provider found Primary Care Provider: Juluis Rainier, MD (Inactive) Reason for consultation: Subjective   Assessment & Plan  Diagnoses and all orders for this visit:  Uncontrolled type 2 diabetes mellitus with hyperglycemia (HCC)  Long term (current) use of oral hypoglycemic drugs  Long-term (current) use of injectable non-insulin antidiabetic drugs  Mixed hypercholesterolemia and hypertriglyceridemia  Other orders -     insulin glargine (LANTUS SOLOSTAR) 100 UNIT/ML Solostar Pen; Inject 50 Units into the skin daily. -     Semaglutide, 2 MG/DOSE, (OZEMPIC, 2 MG/DOSE,) 8 MG/3ML SOPN; Inject 2 mg into the skin once a week.    Diabetes Type II complicated by hyperglycemia,  Lab Results  Component Value Date   GFR 86.59 06/26/2023   Hba1c goal less than 7, current Hba1c is  Lab Results  Component Value Date   HGBA1C 10.4 (H) 06/26/2023   Will recommend the following: Start Lantus 10 units once every morning. Increase by 1 units a every day until fasting blood sugar is less than 150. Stay on that dose.   Ozempic 2 mg weekly  Metformin 500 mg 2 pills bid  No known contraindications/side effects to any of above medications Patient prefers ozempic over Trulicity   -Last LD and Tg are as follows: Lab Results  Component Value Date   LDLCALC 34 06/26/2023    Lab Results  Component Value Date   TRIG 182.0 (H) 06/26/2023   -On rosuvastatin 10 mg QD -Follow low fat diet and exercise   -Blood pressure goal <140/90 - Microalbumin/creatinine goal is < 30 -Last MA/Cr is as follows: Lab Results  Component Value Date   MICROALBUR 2.4 (H) 06/26/2023   -on ACE/ARB lisinopril 20 mg qd -diet changes including salt restriction -limit eating outside -counseled BP targets per standards of diabetes care -uncontrolled blood pressure can lead to  retinopathy, nephropathy and cardiovascular and atherosclerotic heart disease  Reviewed and counseled on: -A1C target -Blood sugar targets -Complications of uncontrolled diabetes  -Checking blood sugar before meals and bedtime and bring log next visit -All medications with mechanism of action and side effects -Hypoglycemia management: rule of 15's, Glucagon Emergency Kit and medical alert ID -low-carb low-fat plate-method diet -At least 20 minutes of physical activity per day -Annual dilated retinal eye exam and foot exam -compliance and follow up needs -follow up as scheduled or earlier if problem gets worse  Call if blood sugar is less than 70 or consistently above 250    Take a 15 gm snack of carbohydrate at bedtime before you go to sleep if your blood sugar is less than 100.    If you are going to fast after midnight for a test or procedure, ask your physician for instructions on how to reduce/decrease your insulin dose.    Call if blood sugar is less than 70 or consistently above 250  -Treating a low sugar by rule of 15  (15 gms of sugar every 15 min until sugar is more than 70) If you feel your sugar is low, test your sugar to be sure If your sugar is low (less than 70), then take 15 grams of a fast acting Carbohydrate (3-4 glucose tablets or glucose gel or 4 ounces of juice or regular soda) Recheck your sugar 15 min after treating low to make sure it is more than 70 If sugar is still less than  70, treat again with 15 grams of carbohydrate          Don't drive the hour of hypoglycemia  If unconscious/unable to eat or drink by mouth, use glucagon injection or nasal spray baqsimi and call 911. Can repeat again in 15 min if still unconscious.  Return in about 8 weeks (around 08/28/2023).   I have reviewed current medications, nurse's notes, allergies, vital signs, past medical and surgical history, family medical history, and social history for this encounter. Counseled patient on  symptoms, examination findings, lab findings, imaging results, treatment decisions and monitoring and prognosis. The patient understood the recommendations and agrees with the treatment plan. All questions regarding treatment plan were fully answered.  Betty Groveton, MD  07/04/23    History of Present Illness Betty Robinson is a 67 y.o. 67 year old female who presents for evaluation of Type II diabetes mellitus.  Betty Robinson was first diagnosed in 2014.   Diabetes education +  Was on ozempic but couldn't get it.  Home diabetes regimen: Trulicity 3 mg weekly  Metformin 500 mg 2 pills bid  COMPLICATIONS -  MI/Stroke -  retinopathy -  neuropathy -  nephropathy  SYMPTOMS REVIEWED + Polyuria - Weight loss - Blurred vision  BLOOD SUGAR DATA Checks fasting, BG 250    Physical Exam  BP (!) 140/85   Pulse 96   Ht 5\' 1"  (1.549 m)   Wt 191 lb 3.2 oz (86.7 kg)   SpO2 98%   BMI 36.13 kg/m    Constitutional: well developed, well nourished Head: normocephalic, atraumatic Eyes: sclera anicteric, no redness Neck: supple Lungs: normal respiratory effort Neurology: alert and oriented Skin: dry, no appreciable rashes Musculoskeletal: no appreciable defects Psychiatric: normal mood and affect Diabetic Foot Exam - Simple   No data filed      Current Medications Patient's Medications  New Prescriptions   INSULIN GLARGINE (LANTUS SOLOSTAR) 100 UNIT/ML SOLOSTAR PEN    Inject 50 Units into the skin daily.   SEMAGLUTIDE, 2 MG/DOSE, (OZEMPIC, 2 MG/DOSE,) 8 MG/3ML SOPN    Inject 2 mg into the skin once a week.  Previous Medications   B COMPLEX VITAMINS TABLET    Take 1 tablet by mouth daily.   CALCIUM CARBONATE (OS-CAL) 600 MG TABS    Take 1,200 mg by mouth 2 (two) times daily with a meal.   CELECOXIB (CELEBREX) 200 MG CAPSULE    200 mg daily.   FISH OIL-OMEGA-3 FATTY ACIDS 1000 MG CAPSULE    Take 1 g by mouth daily.   GLUCOSAMINE-CHONDROITIN 500-400 MG TABLET    Take 2 tablets by  mouth daily at 6 (six) AM.   LISINOPRIL (ZESTRIL) 20 MG TABLET    Take 20 mg by mouth daily.   LISINOPRIL-HYDROCHLOROTHIAZIDE (ZESTORETIC) 20-12.5 MG TABLET    Take 1 tablet by mouth daily.   LORATADINE (CLARITIN) 10 MG TABLET    Take 10 mg by mouth daily.   MAGNESIUM 250 MG TABS    Take 1 tablet by mouth daily.   METFORMIN (GLUCOPHAGE) 500 MG TABLET    Take 1,000 mg by mouth 2 (two) times daily.   MULTIPLE VITAMIN (MULTIVITAMIN WITH MINERALS) TABS    Take 1 tablet by mouth daily.   ONDANSETRON (ZOFRAN-ODT) 8 MG DISINTEGRATING TABLET    Take 8 mg by mouth every 8 (eight) hours as needed for nausea.   QUINAPRIL-HYDROCHLOROTHIAZIDE (ACCURETIC) 10-12.5 MG PER TABLET    Take 1 tablet by mouth daily before breakfast.  ROSUVASTATIN (CRESTOR) 10 MG TABLET    Take 10 mg by mouth daily.   SERTRALINE (ZOLOFT) 100 MG TABLET    Take 100 mg by mouth daily.  Modified Medications   No medications on file  Discontinued Medications   No medications on file    Allergies No Known Allergies  Past Medical History Past Medical History:  Diagnosis Date   Arthritis    Diabetes mellitus without complication (HCC)    Hypertension    Nystagmus    PONV (postoperative nausea and vomiting)    per pt vomited after receiving Dilaudid   Vertigo     Past Surgical History Past Surgical History:  Procedure Laterality Date   ADENOIDECTOMY     bilateral knee arthroscopic  R 6 years ago L June 2013   CERVICAL FUSION  2011   C2 C3 C4   KNEE ARTHROSCOPY  10/15/2012   Procedure: ARTHROSCOPY KNEE;  Surgeon: [name], MD;  Location: [facility];  Service: Orthopedics;  Laterality: Left;   Left foot surgery  Dec. 2011   TONSILLECTOMY     TOTAL KNEE ARTHROPLASTY  10/15/2012   Procedure: TOTAL KNEE ARTHROPLASTY;  Surgeon: [name], MD;  Location: [facility];  Service: Orthopedics;  Laterality: Right;   TOTAL KNEE ARTHROPLASTY Left 06/13/2016   Procedure: LEFT TOTAL KNEE ARTHROPLASTY;  Surgeon: Durene Romans, MD;   Location: [facility];  Service: Orthopedics;  Laterality: Left;    Family History family history includes Cancer in her brother; Heart disease in her father.  Social History Social History   Socioeconomic History   Marital status: Married    Spouse name: Chrissie Noa   Number of children: 0   Years of education: Not on file   Highest education level: Bachelor's degree (e.g., BA, AB, BS)  Occupational History    Comment: supervisor  Tobacco Use   Smoking status: Never   Smokeless tobacco: Never  Substance and Sexual Activity   Alcohol use: No    Comment: rare   Drug use: No   Sexual activity: Not on file  Other Topics Concern   Not on file  Social History Narrative   Lives with husband   Caffeine- coffee 1 cup daily   Social Determinants of Health   Financial Resource Strain: Not on file  Food Insecurity: Not on file  Transportation Needs: Not on file  Physical Activity: Not on file  Stress: Not on file  Social Connections: Not on file  Intimate Partner Violence: Not on file    Lab Results  Component Value Date   HGBA1C 10.4 (H) 06/26/2023   Lab Results  Component Value Date   CHOL 113 06/26/2023   Lab Results  Component Value Date   HDL 43.00 06/26/2023   Lab Results  Component Value Date   LDLCALC 34 06/26/2023   Lab Results  Component Value Date   TRIG 182.0 (H) 06/26/2023   Lab Results  Component Value Date   CHOLHDL 3 06/26/2023   Lab Results  Component Value Date   CREATININE 0.72 06/26/2023   Lab Results  Component Value Date   GFR 86.59 06/26/2023   Lab Results  Component Value Date   MICROALBUR 2.4 (H) 06/26/2023      Component Value Date/Time   NA 137 06/26/2023 0921   K 4.4 06/26/2023 0921   CL 98 06/26/2023 0921   CO2 29 06/26/2023 0921   GLUCOSE 305 (H) 06/26/2023 0921   BUN 16 06/26/2023 0921   CREATININE  0.72 06/26/2023 0921   CALCIUM 9.9 06/26/2023 0921   PROT 6.6 06/26/2023 0921   ALBUMIN 4.0 06/26/2023 0921   AST 13  06/26/2023 0921   ALT 14 06/26/2023 0921   ALKPHOS 70 06/26/2023 0921   BILITOT 0.3 06/26/2023 0921   GFRNONAA >60 06/14/2016 0413   GFRAA >60 06/14/2016 0413      Latest Ref Rng & Units 06/26/2023    9:21 AM 06/14/2016    4:13 AM 10/17/2012    4:54 AM  BMP  Glucose 70 - 99 mg/dL 657  846  962   BUN 6 - 23 mg/dL 16  17  11    Creatinine 0.40 - 1.20 mg/dL 9.52  8.41  3.24   Sodium 135 - 145 mEq/L 137  138  136   Potassium 3.5 - 5.1 mEq/L 4.4  4.9  4.0   Chloride 96 - 112 mEq/L 98  106  102   CO2 19 - 32 mEq/L 29  26  30    Calcium 8.4 - 10.5 mg/dL 9.9  8.2  8.6        Component Value Date/Time   WBC 14.0 (H) 06/14/2016 0413   RBC 3.29 (L) 06/14/2016 0413   HGB 10.6 (L) 06/14/2016 0413   HCT 31.9 (L) 06/14/2016 0413   PLT 290 06/14/2016 0413   MCV 97.0 06/14/2016 0413   MCH 32.2 06/14/2016 0413   MCHC 33.2 06/14/2016 0413   RDW 14.4 06/14/2016 0413     Parts of this note may have been dictated using voice recognition software. There may be variances in spelling and vocabulary which are unintentional. Not all errors are proofread. Please notify the Thereasa Parkin if any discrepancies are noted or if the meaning of any statement is not clear.

## 2023-07-04 ENCOUNTER — Telehealth: Payer: Self-pay

## 2023-07-04 ENCOUNTER — Other Ambulatory Visit: Payer: Self-pay

## 2023-07-04 DIAGNOSIS — E119 Type 2 diabetes mellitus without complications: Secondary | ICD-10-CM

## 2023-07-04 MED ORDER — OZEMPIC (2 MG/DOSE) 8 MG/3ML ~~LOC~~ SOPN
2.0000 mg | PEN_INJECTOR | SUBCUTANEOUS | 1 refills | Status: DC
Start: 2023-07-04 — End: 2024-01-09

## 2023-07-04 MED ORDER — OZEMPIC (2 MG/DOSE) 8 MG/3ML ~~LOC~~ SOPN: 2.0000 mg | PEN_INJECTOR | SUBCUTANEOUS | 1 refills | Status: DC

## 2023-07-04 NOTE — Telephone Encounter (Signed)
Dr Roosevelt Locks was you going to send in [place] for Chip Boer , I dont see it and she states her Pharmacy doesn't show it

## 2023-07-04 NOTE — Addendum Note (Signed)
Addended byAltamese Octavia on: 07/04/2023 03:04 PM   Modules accepted: Orders

## 2023-08-03 DIAGNOSIS — I10 Essential (primary) hypertension: Secondary | ICD-10-CM | POA: Diagnosis not present

## 2023-08-03 DIAGNOSIS — E7800 Pure hypercholesterolemia, unspecified: Secondary | ICD-10-CM | POA: Diagnosis not present

## 2023-08-03 DIAGNOSIS — Z794 Long term (current) use of insulin: Secondary | ICD-10-CM | POA: Diagnosis not present

## 2023-08-03 DIAGNOSIS — E1165 Type 2 diabetes mellitus with hyperglycemia: Secondary | ICD-10-CM | POA: Diagnosis not present

## 2023-09-03 ENCOUNTER — Encounter: Payer: Self-pay | Admitting: "Endocrinology

## 2023-09-03 ENCOUNTER — Ambulatory Visit: Payer: Medicare Other | Admitting: "Endocrinology

## 2023-09-03 VITALS — BP 130/80 | HR 87 | Ht 61.0 in | Wt 189.8 lb

## 2023-09-03 DIAGNOSIS — Z7985 Long-term (current) use of injectable non-insulin antidiabetic drugs: Secondary | ICD-10-CM

## 2023-09-03 DIAGNOSIS — Z7984 Long term (current) use of oral hypoglycemic drugs: Secondary | ICD-10-CM | POA: Diagnosis not present

## 2023-09-03 DIAGNOSIS — E1165 Type 2 diabetes mellitus with hyperglycemia: Secondary | ICD-10-CM

## 2023-09-03 DIAGNOSIS — E782 Mixed hyperlipidemia: Secondary | ICD-10-CM

## 2023-09-03 MED ORDER — DEXCOM G7 SENSOR MISC: 1.0000 | 1 refills | Status: DC

## 2023-09-03 NOTE — Patient Instructions (Signed)

## 2023-09-03 NOTE — Progress Notes (Signed)
Outpatient Endocrinology Note Altamese [place], MD  09/03/23   Betty Robinson 12-04-1955 [id number]  Referring Provider: No ref. provider found Primary Care Provider: Juluis Rainier, MD (Inactive) Reason for consultation: Subjective   Assessment & Plan  Diagnoses and all orders for this visit:  Uncontrolled type 2 diabetes mellitus with hyperglycemia (HCC)  Long term (current) use of oral hypoglycemic drugs  Long-term (current) use of injectable non-insulin antidiabetic drugs  Mixed hypercholesterolemia and hypertriglyceridemia  Other orders -     Discontinue: Continuous Glucose Sensor (DEXCOM G7 SENSOR) MISC; 1 Device by Does not apply route continuous. -     Continuous Glucose Sensor (DEXCOM G7 SENSOR) MISC; 1 Device by Does not apply route continuous.    Diabetes Type II complicated by hyperglycemia,  Lab Results  Component Value Date   GFR 86.59 06/26/2023   Hba1c goal less than 7, current Hba1c is  Lab Results  Component Value Date   HGBA1C 10.4 (H) 06/26/2023   Will recommend the following: Ozempic 2 mg weekly  Metformin 500 mg 2 pills bid  No known contraindications/side effects to any of above medications Patient prefers ozempic over Trulicity   -Last LD and Tg are as follows: Lab Results  Component Value Date   LDLCALC 34 06/26/2023    Lab Results  Component Value Date   TRIG 182.0 (H) 06/26/2023   -On rosuvastatin 10 mg QD -Follow low fat diet and exercise   -Blood pressure goal <140/90 - Microalbumin/creatinine goal is < 30 -Last MA/Cr is as follows: Lab Results  Component Value Date   MICROALBUR 2.4 (H) 06/26/2023   -on ACE/ARB lisinopril 20 mg qd -diet changes including salt restriction -limit eating outside -counseled BP targets per standards of diabetes care -uncontrolled blood pressure can lead to retinopathy, nephropathy and cardiovascular and atherosclerotic heart disease  Reviewed and counseled on: -A1C  target -Blood sugar targets -Complications of uncontrolled diabetes  -Checking blood sugar before meals and bedtime and bring log next visit -All medications with mechanism of action and side effects -Hypoglycemia management: rule of 15's, Glucagon Emergency Kit and medical alert ID -low-carb low-fat plate-method diet -At least 20 minutes of physical activity per day -Annual dilated retinal eye exam and foot exam -compliance and follow up needs -follow up as scheduled or earlier if problem gets worse  Call if blood sugar is less than 70 or consistently above 250    Take a 15 gm snack of carbohydrate at bedtime before you go to sleep if your blood sugar is less than 100.    If you are going to fast after midnight for a test or procedure, ask your physician for instructions on how to reduce/decrease your insulin dose.    Call if blood sugar is less than 70 or consistently above 250  -Treating a low sugar by rule of 15  (15 gms of sugar every 15 min until sugar is more than 70) If you feel your sugar is low, test your sugar to be sure If your sugar is low (less than 70), then take 15 grams of a fast acting Carbohydrate (3-4 glucose tablets or glucose gel or 4 ounces of juice or regular soda) Recheck your sugar 15 min after treating low to make sure it is more than 70 If sugar is still less than 70, treat again with 15 grams of carbohydrate          Don't drive the hour of hypoglycemia  If unconscious/unable to  eat or drink by mouth, use glucagon injection or nasal spray baqsimi and call 911. Can repeat again in 15 min if still unconscious.  Return in about 3 months (around 12/04/2023).   I have reviewed current medications, nurse's notes, allergies, vital signs, past medical and surgical history, family medical history, and social history for this encounter. Counseled patient on symptoms, examination findings, lab findings, imaging results, treatment decisions and monitoring and prognosis.  The patient understood the recommendations and agrees with the treatment plan. All questions regarding treatment plan were fully answered.  Altamese [place], MD  09/03/23    History of Present Illness Betty Robinson is a 67 y.o. year old female who presents for follow up of Type II diabetes mellitus.  Betty Robinson was first diagnosed in 2014.   Diabetes education +  Currently on: Ozempic 2 mg weekly  Metformin 500 mg 2 pills bid  No known contraindications/side effects to any of above medications Patient prefers ozempic over Trulicity   COMPLICATIONS -  MI/Stroke -  retinopathy -  neuropathy -  nephropathy  SYMPTOMS REVIEWED + Polyuria - Weight loss - Blurred vision  BLOOD SUGAR DATA Checks fasting, BG 250    Physical Exam  BP 130/80   Pulse 87   Ht 5\' 1"  (1.549 m)   Wt 189 lb 12.8 oz (86.1 kg)   SpO2 92%   BMI 35.86 kg/m    Constitutional: well developed, well nourished Head: normocephalic, atraumatic Eyes: sclera anicteric, no redness Neck: supple Lungs: normal respiratory effort Neurology: alert and oriented Skin: dry, no appreciable rashes Musculoskeletal: no appreciable defects Psychiatric: normal mood and affect Diabetic Foot Exam - Simple   Simple Foot Form Diabetic Foot exam was performed with the following findings: Yes 09/03/2023  3:10 PM  Visual Inspection No deformities, no ulcerations, no other skin breakdown bilaterally: Yes Sensation Testing Intact to touch and monofilament testing bilaterally: Yes Pulse Check Posterior Tibialis and Dorsalis pulse intact bilaterally: Yes Comments      Current Medications Patient's Medications  New Prescriptions   CONTINUOUS GLUCOSE SENSOR (DEXCOM G7 SENSOR) MISC    1 Device by Does not apply route continuous.  Previous Medications   B COMPLEX VITAMINS TABLET    Take 1 tablet by mouth daily.   CALCIUM CARBONATE (OS-CAL) 600 MG TABS    Take 1,200 mg by mouth 2 (two) times daily with a meal.   CELECOXIB  (CELEBREX) 200 MG CAPSULE    200 mg daily.   FISH OIL-OMEGA-3 FATTY ACIDS 1000 MG CAPSULE    Take 1 g by mouth daily.   GLUCOSAMINE-CHONDROITIN 500-400 MG TABLET    Take 2 tablets by mouth daily at 6 (six) AM.   INSULIN GLARGINE (LANTUS SOLOSTAR) 100 UNIT/ML SOLOSTAR PEN    Inject 50 Units into the skin daily.   LISINOPRIL (ZESTRIL) 20 MG TABLET    Take 20 mg by mouth daily.   LORATADINE (CLARITIN) 10 MG TABLET    Take 10 mg by mouth daily.   MAGNESIUM 250 MG TABS    Take 1 tablet by mouth daily.   METFORMIN (GLUCOPHAGE) 500 MG TABLET    Take 1,000 mg by mouth 2 (two) times daily.   MULTIPLE VITAMIN (MULTIVITAMIN WITH MINERALS) TABS    Take 1 tablet by mouth daily.   ONDANSETRON (ZOFRAN-ODT) 8 MG DISINTEGRATING TABLET    Take 8 mg by mouth every 8 (eight) hours as needed for nausea.   QUINAPRIL-HYDROCHLOROTHIAZIDE (ACCURETIC) 10-12.5 MG PER TABLET  Take 1 tablet by mouth daily before breakfast.   ROSUVASTATIN (CRESTOR) 10 MG TABLET    Take 10 mg by mouth daily.   SEMAGLUTIDE, 2 MG/DOSE, (OZEMPIC, 2 MG/DOSE,) 8 MG/3ML SOPN    Inject 2 mg into the skin once a week.   SERTRALINE (ZOLOFT) 100 MG TABLET    Take 100 mg by mouth daily.  Modified Medications   No medications on file  Discontinued Medications   LISINOPRIL-HYDROCHLOROTHIAZIDE (ZESTORETIC) 20-12.5 MG TABLET    Take 1 tablet by mouth daily.    Allergies No Known Allergies  Past Medical History Past Medical History:  Diagnosis Date   Arthritis    Diabetes mellitus without complication (HCC)    Hypertension    Nystagmus    PONV (postoperative nausea and vomiting)    per pt vomited after receiving Dilaudid   Vertigo     Past Surgical History Past Surgical History:  Procedure Laterality Date   ADENOIDECTOMY     bilateral knee arthroscopic  R 6 years ago L June 2013   CERVICAL FUSION  2011   C2 C3 C4   KNEE ARTHROSCOPY  10/15/2012   Procedure: ARTHROSCOPY KNEE;  Surgeon: [name], MD;  Location: [facility];  Service:  Orthopedics;  Laterality: Left;   Left foot surgery  Dec. 2011   TONSILLECTOMY     TOTAL KNEE ARTHROPLASTY  10/15/2012   Procedure: TOTAL KNEE ARTHROPLASTY;  Surgeon: [name], MD;  Location: [facility];  Service: Orthopedics;  Laterality: Right;   TOTAL KNEE ARTHROPLASTY Left 06/13/2016   Procedure: LEFT TOTAL KNEE ARTHROPLASTY;  Surgeon: Durene Romans, MD;  Location: [facility];  Service: Orthopedics;  Laterality: Left;    Family History family history includes Cancer in her brother; Heart disease in her father.  Social History Social History   Socioeconomic History   Marital status: Married    Spouse name: Chrissie Noa   Number of children: 0   Years of education: Not on file   Highest education level: Bachelor's degree (e.g., BA, AB, BS)  Occupational History    Comment: supervisor  Tobacco Use   Smoking status: Never   Smokeless tobacco: Never  Substance and Sexual Activity   Alcohol use: No    Comment: rare   Drug use: No   Sexual activity: Not on file  Other Topics Concern   Not on file  Social History Narrative   Lives with husband   Caffeine- coffee 1 cup daily   Social Determinants of Health   Financial Resource Strain: Not on file  Food Insecurity: Not on file  Transportation Needs: Not on file  Physical Activity: Not on file  Stress: Not on file  Social Connections: Not on file  Intimate Partner Violence: Not on file    Lab Results  Component Value Date   HGBA1C 10.4 (H) 06/26/2023   Lab Results  Component Value Date   CHOL 113 06/26/2023   Lab Results  Component Value Date   HDL 43.00 06/26/2023   Lab Results  Component Value Date   LDLCALC 34 06/26/2023   Lab Results  Component Value Date   TRIG 182.0 (H) 06/26/2023   Lab Results  Component Value Date   CHOLHDL 3 06/26/2023   Lab Results  Component Value Date   CREATININE 0.72 06/26/2023   Lab Results  Component Value Date   GFR 86.59 06/26/2023   Lab Results  Component Value Date    MICROALBUR 2.4 (H) 06/26/2023  Component Value Date/Time   NA 137 06/26/2023 0921   K 4.4 06/26/2023 0921   CL 98 06/26/2023 0921   CO2 29 06/26/2023 0921   GLUCOSE 305 (H) 06/26/2023 0921   BUN 16 06/26/2023 0921   CREATININE 0.72 06/26/2023 0921   CALCIUM 9.9 06/26/2023 0921   PROT 6.6 06/26/2023 0921   ALBUMIN 4.0 06/26/2023 0921   AST 13 06/26/2023 0921   ALT 14 06/26/2023 0921   ALKPHOS 70 06/26/2023 0921   BILITOT 0.3 06/26/2023 0921   GFRNONAA >60 06/14/2016 0413   GFRAA >60 06/14/2016 0413      Latest Ref Rng & Units 06/26/2023    9:21 AM 06/14/2016    4:13 AM 10/17/2012    4:54 AM  BMP  Glucose 70 - 99 mg/dL 440  102  725   BUN 6 - 23 mg/dL 16  17  11    Creatinine 0.40 - 1.20 mg/dL 3.66  4.40  3.47   Sodium 135 - 145 mEq/L 137  138  136   Potassium 3.5 - 5.1 mEq/L 4.4  4.9  4.0   Chloride 96 - 112 mEq/L 98  106  102   CO2 19 - 32 mEq/L 29  26  30    Calcium 8.4 - 10.5 mg/dL 9.9  8.2  8.6        Component Value Date/Time   WBC 14.0 (H) 06/14/2016 0413   RBC 3.29 (L) 06/14/2016 0413   HGB 10.6 (L) 06/14/2016 0413   HCT 31.9 (L) 06/14/2016 0413   PLT 290 06/14/2016 0413   MCV 97.0 06/14/2016 0413   MCH 32.2 06/14/2016 0413   MCHC 33.2 06/14/2016 0413   RDW 14.4 06/14/2016 0413     Parts of this note may have been dictated using voice recognition software. There may be variances in spelling and vocabulary which are unintentional. Not all errors are proofread. Please notify the Thereasa Parkin if any discrepancies are noted or if the meaning of any statement is not clear.   Lantus 48 units once every morning Ozempic 2 mg weekly  Metformin 500 mg 2 pills bid  No known contraindications/side effects to any of above medications Patient prefers ozempic over Trulicity   -Last LD and Tg are as follows: Lab Results  Component Value Date   LDLCALC 34 06/26/2023    Lab Results  Component Value Date   TRIG 182.0 (H) 06/26/2023   -On rosuvastatin 10 mg  QD -Follow low fat diet and exercise   -Blood pressure goal <140/90 - Microalbumin/creatinine goal is < 30 -Last MA/Cr is as follows: Lab Results  Component Value Date   MICROALBUR 2.4 (H) 06/26/2023   -on ACE/ARB lisinopril 20 mg qd -diet changes including salt restriction -limit eating outside -counseled BP targets per standards of diabetes care -uncontrolled blood pressure can lead to retinopathy, nephropathy and cardiovascular and atherosclerotic heart disease  Reviewed and counseled on: -A1C target -Blood sugar targets -Complications of uncontrolled diabetes  -Checking blood sugar before meals and bedtime and bring log next visit -All medications with mechanism of action and side effects -Hypoglycemia management: rule of 15's, Glucagon Emergency Kit and medical alert ID -low-carb low-fat plate-method diet -At least 20 minutes of physical activity per day -Annual dilated retinal eye exam and foot exam -compliance and follow up needs -follow up as scheduled or earlier if problem gets worse  Call if blood sugar is less than 70 or consistently above 250    Take a 15 gm snack of  carbohydrate at bedtime before you go to sleep if your blood sugar is less than 100.    If you are going to fast after midnight for a test or procedure, ask your physician for instructions on how to reduce/decrease your insulin dose.    Call if blood sugar is less than 70 or consistently above 250  -Treating a low sugar by rule of 15  (15 gms of sugar every 15 min until sugar is more than 70) If you feel your sugar is low, test your sugar to be sure If your sugar is low (less than 70), then take 15 grams of a fast acting Carbohydrate (3-4 glucose tablets or glucose gel or 4 ounces of juice or regular soda) Recheck your sugar 15 min after treating low to make sure it is more than 70 If sugar is still less than 70, treat again with 15 grams of carbohydrate          Don't drive the hour of hypoglycemia   If unconscious/unable to eat or drink by mouth, use glucagon injection or nasal spray baqsimi and call 911. Can repeat again in 15 min if still unconscious.  Return in about 3 months (around 12/04/2023).   I have reviewed current medications, nurse's notes, allergies, vital signs, past medical and surgical history, family medical history, and social history for this encounter. Counseled patient on symptoms, examination findings, lab findings, imaging results, treatment decisions and monitoring and prognosis. The patient understood the recommendations and agrees with the treatment plan. All questions regarding treatment plan were fully answered.  Altamese [place], MD  09/03/23    History of Present Illness Betty Robinson is a 67 y.o. year old female who presents for evaluation of Type II diabetes mellitus.  SAGE HAMMILL was first diagnosed in 2014.   Diabetes education +  Was on ozempic but couldn't get it.  Home diabetes regimen: Ozempic 2 mg weekly  Metformin 500 mg 2 pills bid  Previously on trulicity, pt likes ozempic better   COMPLICATIONS -  MI/Stroke -  retinopathy -  neuropathy -  nephropathy  SYMPTOMS REVIEWED + Polyuria - Weight loss - Blurred vision  BLOOD SUGAR DATA Checks fasting, BG 83-153   Physical Exam  BP 130/80   Pulse 87   Ht 5\' 1"  (1.549 m)   Wt 189 lb 12.8 oz (86.1 kg)   SpO2 92%   BMI 35.86 kg/m    Constitutional: well developed, well nourished Head: normocephalic, atraumatic Eyes: sclera anicteric, no redness Neck: supple Lungs: normal respiratory effort Neurology: alert and oriented Skin: dry, no appreciable rashes Musculoskeletal: no appreciable defects Psychiatric: normal mood and affect Diabetic Foot Exam - Simple   Simple Foot Form Diabetic Foot exam was performed with the following findings: Yes 09/03/2023  3:10 PM  Visual Inspection No deformities, no ulcerations, no other skin breakdown bilaterally: Yes Sensation  Testing Intact to touch and monofilament testing bilaterally: Yes Pulse Check Posterior Tibialis and Dorsalis pulse intact bilaterally: Yes Comments      Current Medications Patient's Medications  New Prescriptions   CONTINUOUS GLUCOSE SENSOR (DEXCOM G7 SENSOR) MISC    1 Device by Does not apply route continuous.  Previous Medications   B COMPLEX VITAMINS TABLET    Take 1 tablet by mouth daily.   CALCIUM CARBONATE (OS-CAL) 600 MG TABS    Take 1,200 mg by mouth 2 (two) times daily with a meal.   CELECOXIB (CELEBREX) 200 MG CAPSULE    200 mg  daily.   FISH OIL-OMEGA-3 FATTY ACIDS 1000 MG CAPSULE    Take 1 g by mouth daily.   GLUCOSAMINE-CHONDROITIN 500-400 MG TABLET    Take 2 tablets by mouth daily at 6 (six) AM.   INSULIN GLARGINE (LANTUS SOLOSTAR) 100 UNIT/ML SOLOSTAR PEN    Inject 50 Units into the skin daily.   LISINOPRIL (ZESTRIL) 20 MG TABLET    Take 20 mg by mouth daily.   LORATADINE (CLARITIN) 10 MG TABLET    Take 10 mg by mouth daily.   MAGNESIUM 250 MG TABS    Take 1 tablet by mouth daily.   METFORMIN (GLUCOPHAGE) 500 MG TABLET    Take 1,000 mg by mouth 2 (two) times daily.   MULTIPLE VITAMIN (MULTIVITAMIN WITH MINERALS) TABS    Take 1 tablet by mouth daily.   ONDANSETRON (ZOFRAN-ODT) 8 MG DISINTEGRATING TABLET    Take 8 mg by mouth every 8 (eight) hours as needed for nausea.   QUINAPRIL-HYDROCHLOROTHIAZIDE (ACCURETIC) 10-12.5 MG PER TABLET    Take 1 tablet by mouth daily before breakfast.   ROSUVASTATIN (CRESTOR) 10 MG TABLET    Take 10 mg by mouth daily.   SEMAGLUTIDE, 2 MG/DOSE, (OZEMPIC, 2 MG/DOSE,) 8 MG/3ML SOPN    Inject 2 mg into the skin once a week.   SERTRALINE (ZOLOFT) 100 MG TABLET    Take 100 mg by mouth daily.  Modified Medications   No medications on file  Discontinued Medications   LISINOPRIL-HYDROCHLOROTHIAZIDE (ZESTORETIC) 20-12.5 MG TABLET    Take 1 tablet by mouth daily.    Allergies No Known Allergies  Past Medical History Past Medical History:   Diagnosis Date   Arthritis    Diabetes mellitus without complication (HCC)    Hypertension    Nystagmus    PONV (postoperative nausea and vomiting)    per pt vomited after receiving Dilaudid   Vertigo     Past Surgical History Past Surgical History:  Procedure Laterality Date   ADENOIDECTOMY     bilateral knee arthroscopic  R 6 years ago L June 2013   CERVICAL FUSION  2011   C2 C3 C4   KNEE ARTHROSCOPY  10/15/2012   Procedure: ARTHROSCOPY KNEE;  Surgeon: [name], MD;  Location: [facility];  Service: Orthopedics;  Laterality: Left;   Left foot surgery  Dec. 2011   TONSILLECTOMY     TOTAL KNEE ARTHROPLASTY  10/15/2012   Procedure: TOTAL KNEE ARTHROPLASTY;  Surgeon: [name], MD;  Location: [facility];  Service: Orthopedics;  Laterality: Right;   TOTAL KNEE ARTHROPLASTY Left 06/13/2016   Procedure: LEFT TOTAL KNEE ARTHROPLASTY;  Surgeon: Durene Romans, MD;  Location: [facility];  Service: Orthopedics;  Laterality: Left;    Family History family history includes Cancer in her brother; Heart disease in her father.  Social History Social History   Socioeconomic History   Marital status: Married    Spouse name: Chrissie Noa   Number of children: 0   Years of education: Not on file   Highest education level: Bachelor's degree (e.g., BA, AB, BS)  Occupational History    Comment: supervisor  Tobacco Use   Smoking status: Never   Smokeless tobacco: Never  Substance and Sexual Activity   Alcohol use: No    Comment: rare   Drug use: No   Sexual activity: Not on file  Other Topics Concern   Not on file  Social History Narrative   Lives with husband   Caffeine- coffee 1 cup daily  Social Determinants of Health   Financial Resource Strain: Not on file  Food Insecurity: Not on file  Transportation Needs: Not on file  Physical Activity: Not on file  Stress: Not on file  Social Connections: Not on file  Intimate Partner Violence: Not on file    Lab Results  Component  Value Date   HGBA1C 10.4 (H) 06/26/2023   Lab Results  Component Value Date   CHOL 113 06/26/2023   Lab Results  Component Value Date   HDL 43.00 06/26/2023   Lab Results  Component Value Date   LDLCALC 34 06/26/2023   Lab Results  Component Value Date   TRIG 182.0 (H) 06/26/2023   Lab Results  Component Value Date   CHOLHDL 3 06/26/2023   Lab Results  Component Value Date   CREATININE 0.72 06/26/2023   Lab Results  Component Value Date   GFR 86.59 06/26/2023   Lab Results  Component Value Date   MICROALBUR 2.4 (H) 06/26/2023      Component Value Date/Time   NA 137 06/26/2023 0921   K 4.4 06/26/2023 0921   CL 98 06/26/2023 0921   CO2 29 06/26/2023 0921   GLUCOSE 305 (H) 06/26/2023 0921   BUN 16 06/26/2023 0921   CREATININE 0.72 06/26/2023 0921   CALCIUM 9.9 06/26/2023 0921   PROT 6.6 06/26/2023 0921   ALBUMIN 4.0 06/26/2023 0921   AST 13 06/26/2023 0921   ALT 14 06/26/2023 0921   ALKPHOS 70 06/26/2023 0921   BILITOT 0.3 06/26/2023 0921   GFRNONAA >60 06/14/2016 0413   GFRAA >60 06/14/2016 0413      Latest Ref Rng & Units 06/26/2023    9:21 AM 06/14/2016    4:13 AM 10/17/2012    4:54 AM  BMP  Glucose 70 - 99 mg/dL 829  562  130   BUN 6 - 23 mg/dL 16  17  11    Creatinine 0.40 - 1.20 mg/dL 8.65  7.84  6.96   Sodium 135 - 145 mEq/L 137  138  136   Potassium 3.5 - 5.1 mEq/L 4.4  4.9  4.0   Chloride 96 - 112 mEq/L 98  106  102   CO2 19 - 32 mEq/L 29  26  30    Calcium 8.4 - 10.5 mg/dL 9.9  8.2  8.6        Component Value Date/Time   WBC 14.0 (H) 06/14/2016 0413   RBC 3.29 (L) 06/14/2016 0413   HGB 10.6 (L) 06/14/2016 0413   HCT 31.9 (L) 06/14/2016 0413   PLT 290 06/14/2016 0413   MCV 97.0 06/14/2016 0413   MCH 32.2 06/14/2016 0413   MCHC 33.2 06/14/2016 0413   RDW 14.4 06/14/2016 0413     Parts of this note may have been dictated using voice recognition software. There may be variances in spelling and vocabulary which are unintentional. Not all  errors are proofread. Please notify the There[facility] Parkin if any discrepancies are noted or if the meaning of any statement is not clear.

## 2023-11-09 ENCOUNTER — Telehealth: Payer: Self-pay | Admitting: "Endocrinology

## 2023-11-09 ENCOUNTER — Telehealth: Payer: Self-pay

## 2023-11-09 NOTE — Telephone Encounter (Signed)
Patient states she feels her meds may be making her to have GI related symptoms. Per patient she is having gas and diarrhea at night/early mornings for which patient taking gas relief med and antidiarrheal. Patient also states that her blood sugar around that time has also been low lowest been 48, and most recent this morning in 60s range.

## 2023-11-09 NOTE — Telephone Encounter (Signed)
Patient is calling and is having problems with a medication she is taking, advises has called and left messages ,but has not heard back yet - does not know if it is her Ozempic, Metformin, or Lantus that is causing her to not be well -

## 2023-11-12 ENCOUNTER — Encounter: Payer: Self-pay | Admitting: "Endocrinology

## 2023-11-12 ENCOUNTER — Other Ambulatory Visit: Payer: Self-pay

## 2023-11-12 ENCOUNTER — Telehealth (INDEPENDENT_AMBULATORY_CARE_PROVIDER_SITE_OTHER): Payer: Medicare Other | Admitting: "Endocrinology

## 2023-11-12 DIAGNOSIS — E11649 Type 2 diabetes mellitus with hypoglycemia without coma: Secondary | ICD-10-CM | POA: Diagnosis not present

## 2023-11-12 DIAGNOSIS — E782 Mixed hyperlipidemia: Secondary | ICD-10-CM

## 2023-11-12 DIAGNOSIS — Z7984 Long term (current) use of oral hypoglycemic drugs: Secondary | ICD-10-CM

## 2023-11-12 DIAGNOSIS — Z7985 Long-term (current) use of injectable non-insulin antidiabetic drugs: Secondary | ICD-10-CM | POA: Diagnosis not present

## 2023-11-12 MED ORDER — METFORMIN HCL ER 500 MG PO TB24: 500.0000 mg | ORAL_TABLET | Freq: Two times a day (BID) | ORAL | 4 refills | Status: DC

## 2023-11-12 MED ORDER — SEMAGLUTIDE (1 MG/DOSE) 4 MG/3ML ~~LOC~~ SOPN: 1.0000 mg | PEN_INJECTOR | SUBCUTANEOUS | 1 refills | Status: DC

## 2023-11-12 NOTE — Progress Notes (Signed)
The patient reports they are currently: Betty Robinson. I spent 12-13 minutes on the video with the patient on the date of service. I spent an additional 5-10 minutes on pre- and post-visit activities on the date of service.   The patient was physically located in [place] or a state in which I am permitted to provide care. The patient and/or parent/guardian understood that s/he may incur co-pays and cost sharing, and agreed to the telemedicine visit. The visit was reasonable and appropriate under the circumstances given the patient's presentation at the time.  The patient and/or parent/guardian has been advised of the potential risks and limitations of this mode of treatment (including, but not limited to, the absence of in-person examination) and has agreed to be treated using telemedicine. The patient's/patient's family's questions regarding telemedicine have been answered.   The patient and/or parent/guardian has also been advised to contact their provider's office for worsening conditions, and seek emergency medical treatment and/or call 911 if the patient deems either necessary.    Outpatient Endocrinology Note Betty Camanche, MD  11/12/23   [name] 1956/07/07 [id number]  Referring Provider: No ref. provider found Primary Care Provider: Juluis Rainier, MD (Inactive) Reason for consultation: Subjective   Assessment & Plan  Diagnoses and all orders for this visit:  Uncontrolled type 2 diabetes mellitus with hypoglycemia without coma (HCC)  Long term (current) use of oral hypoglycemic drugs  Long-term (current) use of injectable non-insulin antidiabetic drugs  Mixed hypercholesterolemia and hypertriglyceridemia  Other orders -     metFORMIN (GLUCOPHAGE-XR) 500 MG 24 hr tablet; Take 1 tablet (500 mg total) by mouth 2 (two) times daily with a meal. -     Semaglutide, 1 MG/DOSE, 4 MG/3ML SOPN; Inject 1 mg as directed once a week.     Diabetes Type II complicated by  hyperglycemia,  Lab Results  Component Value Date   GFR 86.59 06/26/2023   Hba1c goal less than 7, current Hba1c is  Lab Results  Component Value Date   HGBA1C 10.4 (H) 06/26/2023   Will recommend the following: Ozempic 1 mg weekly (decreased from 2 mg due to BG <70 and patient having GI S/E, although may not be related to meds given that patient has been stable on these medication doses for 2 mo) Metformin 500 XR mg 2 pills bid to avoid GI S/E  No known contraindications/side effects to any of above medications Patient prefers ozempic over Trulicity   -Last LD and Tg are as follows: Lab Results  Component Value Date   LDLCALC 34 06/26/2023    Lab Results  Component Value Date   TRIG 182.0 (H) 06/26/2023   -On rosuvastatin 10 mg QD -Follow low fat diet and exercise   -Blood pressure goal <140/90 - Microalbumin/creatinine goal is < 30 -Last MA/Cr is as follows: Lab Results  Component Value Date   MICROALBUR 2.4 (H) 06/26/2023   -on ACE/ARB lisinopril 20 mg qd -diet changes including salt restriction -limit eating outside -counseled BP targets per standards of diabetes care -uncontrolled blood pressure can lead to retinopathy, nephropathy and cardiovascular and atherosclerotic heart disease  Reviewed and counseled on: -A1C target -Blood sugar targets -Complications of uncontrolled diabetes  -Checking blood sugar before meals and bedtime and bring log next visit -All medications with mechanism of action and side effects -Hypoglycemia management: rule of 15's, Glucagon Emergency Kit and medical alert ID -low-carb low-fat plate-method diet -At least 20 minutes of physical activity per day -Annual dilated retinal eye exam  and foot exam -compliance and follow up needs -follow up as scheduled or earlier if problem gets worse  Call if blood sugar is less than 70 or consistently above 250    Take a 15 gm snack of carbohydrate at bedtime before you go to sleep if your  blood sugar is less than 100.    If you are going to fast after midnight for a test or procedure, ask your physician for instructions on how to reduce/decrease your insulin dose.    Call if blood sugar is less than 70 or consistently above 250  -Treating a low sugar by rule of 15  (15 gms of sugar every 15 min until sugar is more than 70) If you feel your sugar is low, test your sugar to be sure If your sugar is low (less than 70), then take 15 grams of a fast acting Carbohydrate (3-4 glucose tablets or glucose gel or 4 ounces of juice or regular soda) Recheck your sugar 15 min after treating low to make sure it is more than 70 If sugar is still less than 70, treat again with 15 grams of carbohydrate          Don't drive the hour of hypoglycemia  If unconscious/unable to eat or drink by mouth, use glucagon injection or nasal spray baqsimi and call 911. Can repeat again in 15 min if still unconscious.  Return in about 11 weeks (around 01/28/2024).   I have reviewed current medications, nurse's notes, allergies, vital signs, past medical and surgical history, family medical history, and social history for this encounter. Counseled patient on symptoms, examination findings, lab findings, imaging results, treatment decisions and monitoring and prognosis. The patient understood the recommendations and agrees with the treatment plan. All questions regarding treatment plan were fully answered.  Betty [place], MD  11/12/23    History of Present Illness Betty Robinson is a 67 y.o. year old female who presents for follow up of Type II diabetes mellitus.  Betty Robinson was first diagnosed in 2014.   Diabetes education +  Currently on: Ozempic 2 mg weekly - no weight loss  Metformin 500 mg 2 pills bid  No known contraindications/side effects to any of above medications Patient prefers ozempic over Trulicity   COMPLICATIONS -  MI/Stroke -  retinopathy -  neuropathy -  nephropathy  BLOOD  SUGAR DATA BG 135 average per pt on her DexCom   Physical Exam  There were no vitals taken for this visit.   Constitutional: well developed, well nourished Head: normocephalic, atraumatic Eyes: sclera anicteric, no redness Neck: supple Lungs: normal respiratory effort Neurology: alert and oriented Skin: dry, no appreciable rashes Musculoskeletal: no appreciable defects Psychiatric: normal mood and affect Diabetic Foot Exam - Simple   No data filed      Current Medications Patient's Medications  New Prescriptions   METFORMIN (GLUCOPHAGE-XR) 500 MG 24 HR TABLET    Take 1 tablet (500 mg total) by mouth 2 (two) times daily with a meal.   SEMAGLUTIDE, 1 MG/DOSE, 4 MG/3ML SOPN    Inject 1 mg as directed once a week.  Previous Medications   B COMPLEX VITAMINS TABLET    Take 1 tablet by mouth daily.   CALCIUM CARBONATE (OS-CAL) 600 MG TABS    Take 1,200 mg by mouth 2 (two) times daily with a meal.   CELECOXIB (CELEBREX) 200 MG CAPSULE    200 mg daily.   CONTINUOUS GLUCOSE SENSOR (DEXCOM G7 SENSOR) MISC  1 Device by Does not apply route continuous.   FISH OIL-OMEGA-3 FATTY ACIDS 1000 MG CAPSULE    Take 1 g by mouth daily.   GLUCOSAMINE-CHONDROITIN 500-400 MG TABLET    Take 2 tablets by mouth daily at 6 (six) AM.   INSULIN GLARGINE (LANTUS SOLOSTAR) 100 UNIT/ML SOLOSTAR PEN    Inject 50 Units into the skin daily.   LISINOPRIL (ZESTRIL) 20 MG TABLET    Take 20 mg by mouth daily.   LORATADINE (CLARITIN) 10 MG TABLET    Take 10 mg by mouth daily.   MAGNESIUM 250 MG TABS    Take 1 tablet by mouth daily.   MULTIPLE VITAMIN (MULTIVITAMIN WITH MINERALS) TABS    Take 1 tablet by mouth daily.   ONDANSETRON (ZOFRAN-ODT) 8 MG DISINTEGRATING TABLET    Take 8 mg by mouth every 8 (eight) hours as needed for nausea.   QUINAPRIL-HYDROCHLOROTHIAZIDE (ACCURETIC) 10-12.5 MG PER TABLET    Take 1 tablet by mouth daily before breakfast.   ROSUVASTATIN (CRESTOR) 10 MG TABLET    Take 10 mg by mouth daily.    SERTRALINE (ZOLOFT) 100 MG TABLET    Take 100 mg by mouth daily.  Modified Medications   No medications on file  Discontinued Medications   METFORMIN (GLUCOPHAGE) 500 MG TABLET    Take 1,000 mg by mouth 2 (two) times daily.    Allergies No Known Allergies  Past Medical History Past Medical History:  Diagnosis Date   Arthritis    Diabetes mellitus without complication (HCC)    Hypertension    Nystagmus    PONV (postoperative nausea and vomiting)    per pt vomited after receiving Dilaudid   Vertigo     Past Surgical History Past Surgical History:  Procedure Laterality Date   ADENOIDECTOMY     bilateral knee arthroscopic  R 6 years ago L June 2013   CERVICAL FUSION  2011   C2 C3 C4   KNEE ARTHROSCOPY  10/15/2012   Procedure: ARTHROSCOPY KNEE;  Surgeon: [name], MD;  Location: [facility];  Service: Orthopedics;  Laterality: Left;   Left foot surgery  Dec. 2011   TONSILLECTOMY     TOTAL KNEE ARTHROPLASTY  10/15/2012   Procedure: TOTAL KNEE ARTHROPLASTY;  Surgeon: [name], MD;  Location: [facility];  Service: Orthopedics;  Laterality: Right;   TOTAL KNEE ARTHROPLASTY Left 06/13/2016   Procedure: LEFT TOTAL KNEE ARTHROPLASTY;  Surgeon: Durene Romans, MD;  Location: [facility];  Service: Orthopedics;  Laterality: Left;    Family History family history includes Cancer in her brother; Heart disease in her father.  Social History Social History   Socioeconomic History   Marital status: Married    Spouse name: Chrissie Noa   Number of children: 0   Years of education: Not on file   Highest education level: Bachelor's degree (e.g., BA, AB, BS)  Occupational History    Comment: supervisor  Tobacco Use   Smoking status: Never   Smokeless tobacco: Never  Substance and Sexual Activity   Alcohol use: No    Comment: rare   Drug use: No   Sexual activity: Not on file  Other Topics Concern   Not on file  Social History Narrative   Lives with husband   Caffeine- coffee 1 cup  daily   Social Drivers of Corporate investment banker Strain: Not on file  Food Insecurity: Not on file  Transportation Needs: Not on file  Physical Activity: Not on  file  Stress: Not on file  Social Connections: Not on file  Intimate Partner Violence: Not on file    Lab Results  Component Value Date   HGBA1C 10.4 (H) 06/26/2023   Lab Results  Component Value Date   CHOL 113 06/26/2023   Lab Results  Component Value Date   HDL 43.00 06/26/2023   Lab Results  Component Value Date   LDLCALC 34 06/26/2023   Lab Results  Component Value Date   TRIG 182.0 (H) 06/26/2023   Lab Results  Component Value Date   CHOLHDL 3 06/26/2023   Lab Results  Component Value Date   CREATININE 0.72 06/26/2023   Lab Results  Component Value Date   GFR 86.59 06/26/2023   Lab Results  Component Value Date   MICROALBUR 2.4 (H) 06/26/2023      Component Value Date/Time   NA 137 06/26/2023 0921   K 4.4 06/26/2023 0921   CL 98 06/26/2023 0921   CO2 29 06/26/2023 0921   GLUCOSE 305 (H) 06/26/2023 0921   BUN 16 06/26/2023 0921   CREATININE 0.72 06/26/2023 0921   CALCIUM 9.9 06/26/2023 0921   PROT 6.6 06/26/2023 0921   ALBUMIN 4.0 06/26/2023 0921   AST 13 06/26/2023 0921   ALT 14 06/26/2023 0921   ALKPHOS 70 06/26/2023 0921   BILITOT 0.3 06/26/2023 0921   GFRNONAA >60 06/14/2016 0413   GFRAA >60 06/14/2016 0413      Latest Ref Rng & Units 06/26/2023    9:21 AM 06/14/2016    4:13 AM 10/17/2012    4:54 AM  BMP  Glucose 70 - 99 mg/dL 664  403  474   BUN 6 - 23 mg/dL 16  17  11    Creatinine 0.40 - 1.20 mg/dL 2.59  5.63  8.75   Sodium 135 - 145 mEq/L 137  138  136   Potassium 3.5 - 5.1 mEq/L 4.4  4.9  4.0   Chloride 96 - 112 mEq/L 98  106  102   CO2 19 - 32 mEq/L 29  26  30    Calcium 8.4 - 10.5 mg/dL 9.9  8.2  8.6        Component Value Date/Time   WBC 14.0 (H) 06/14/2016 0413   RBC 3.29 (L) 06/14/2016 0413   HGB 10.6 (L) 06/14/2016 0413   HCT 31.9 (L) 06/14/2016 0413    PLT 290 06/14/2016 0413   MCV 97.0 06/14/2016 0413   MCH 32.2 06/14/2016 0413   MCHC 33.2 06/14/2016 0413   RDW 14.4 06/14/2016 0413     Parts of this note may have been dictated using voice recognition software. There may be variances in spelling and vocabulary which are unintentional. Not all errors are proofread. Please notify the Thereasa Parkin if any discrepancies are noted or if the meaning of any statement is not clear.   Lantus 48 units once every morning Ozempic 2 mg weekly  Metformin 500 mg 2 pills bid  No known contraindications/side effects to any of above medications Patient prefers ozempic over Trulicity   -Last LD and Tg are as follows: Lab Results  Component Value Date   LDLCALC 34 06/26/2023    Lab Results  Component Value Date   TRIG 182.0 (H) 06/26/2023   -On rosuvastatin 10 mg QD -Follow low fat diet and exercise   -Blood pressure goal <140/90 - Microalbumin/creatinine goal is < 30 -Last MA/Cr is as follows: Lab Results  Component Value Date   MICROALBUR 2.4 (H)  06/26/2023   -on ACE/ARB lisinopril 20 mg qd -diet changes including salt restriction -limit eating outside -counseled BP targets per standards of diabetes care -uncontrolled blood pressure can lead to retinopathy, nephropathy and cardiovascular and atherosclerotic heart disease  Reviewed and counseled on: -A1C target -Blood sugar targets -Complications of uncontrolled diabetes  -Checking blood sugar before meals and bedtime and bring log next visit -All medications with mechanism of action and side effects -Hypoglycemia management: rule of 15's, Glucagon Emergency Kit and medical alert ID -low-carb low-fat plate-method diet -At least 20 minutes of physical activity per day -Annual dilated retinal eye exam and foot exam -compliance and follow up needs -follow up as scheduled or earlier if problem gets worse  Call if blood sugar is less than 70 or consistently above 250    Take a 15 gm  snack of carbohydrate at bedtime before you go to sleep if your blood sugar is less than 100.    If you are going to fast after midnight for a test or procedure, ask your physician for instructions on how to reduce/decrease your insulin dose.    Call if blood sugar is less than 70 or consistently above 250  -Treating a low sugar by rule of 15  (15 gms of sugar every 15 min until sugar is more than 70) If you feel your sugar is low, test your sugar to be sure If your sugar is low (less than 70), then take 15 grams of a fast acting Carbohydrate (3-4 glucose tablets or glucose gel or 4 ounces of juice or regular soda) Recheck your sugar 15 min after treating low to make sure it is more than 70 If sugar is still less than 70, treat again with 15 grams of carbohydrate          Don't drive the hour of hypoglycemia  If unconscious/unable to eat or drink by mouth, use glucagon injection or nasal spray baqsimi and call 911. Can repeat again in 15 min if still unconscious.  Return in about 11 weeks (around 01/28/2024).   I have reviewed current medications, nurse's notes, allergies, vital signs, past medical and surgical history, family medical history, and social history for this encounter. Counseled patient on symptoms, examination findings, lab findings, imaging results, treatment decisions and monitoring and prognosis. The patient understood the recommendations and agrees with the treatment plan. All questions regarding treatment plan were fully answered.  Betty [place], MD  11/12/23    History of Present Illness Betty Robinson is a 67 y.o. year old female who presents for evaluation of Type II diabetes mellitus.  TANGERINE LOOBY was first diagnosed in 2014.   Diabetes education +  Was on ozempic but couldn't get it.  Home diabetes regimen: Ozempic 2 mg weekly  Metformin 500 mg 2 pills bid  Previously on trulicity, pt likes ozempic better   COMPLICATIONS -  MI/Stroke -  retinopathy -   neuropathy -  nephropathy  SYMPTOMS REVIEWED + Polyuria - Weight loss - Blurred vision  BLOOD SUGAR DATA Checks fasting, BG 83-153   Physical Exam  There were no vitals taken for this visit.   Constitutional: well developed, well nourished Head: normocephalic, atraumatic Eyes: sclera anicteric, no redness Neck: supple Lungs: normal respiratory effort Neurology: alert and oriented Skin: dry, no appreciable rashes Musculoskeletal: no appreciable defects Psychiatric: normal mood and affect Diabetic Foot Exam - Simple   No data filed      Current Medications Patient's Medications  New Prescriptions  METFORMIN (GLUCOPHAGE-XR) 500 MG 24 HR TABLET    Take 1 tablet (500 mg total) by mouth 2 (two) times daily with a meal.   SEMAGLUTIDE, 1 MG/DOSE, 4 MG/3ML SOPN    Inject 1 mg as directed once a week.  Previous Medications   B COMPLEX VITAMINS TABLET    Take 1 tablet by mouth daily.   CALCIUM CARBONATE (OS-CAL) 600 MG TABS    Take 1,200 mg by mouth 2 (two) times daily with a meal.   CELECOXIB (CELEBREX) 200 MG CAPSULE    200 mg daily.   CONTINUOUS GLUCOSE SENSOR (DEXCOM G7 SENSOR) MISC    1 Device by Does not apply route continuous.   FISH OIL-OMEGA-3 FATTY ACIDS 1000 MG CAPSULE    Take 1 g by mouth daily.   GLUCOSAMINE-CHONDROITIN 500-400 MG TABLET    Take 2 tablets by mouth daily at 6 (six) AM.   INSULIN GLARGINE (LANTUS SOLOSTAR) 100 UNIT/ML SOLOSTAR PEN    Inject 50 Units into the skin daily.   LISINOPRIL (ZESTRIL) 20 MG TABLET    Take 20 mg by mouth daily.   LORATADINE (CLARITIN) 10 MG TABLET    Take 10 mg by mouth daily.   MAGNESIUM 250 MG TABS    Take 1 tablet by mouth daily.   MULTIPLE VITAMIN (MULTIVITAMIN WITH MINERALS) TABS    Take 1 tablet by mouth daily.   ONDANSETRON (ZOFRAN-ODT) 8 MG DISINTEGRATING TABLET    Take 8 mg by mouth every 8 (eight) hours as needed for nausea.   QUINAPRIL-HYDROCHLOROTHIAZIDE (ACCURETIC) 10-12.5 MG PER TABLET    Take 1 tablet by mouth  daily before breakfast.   ROSUVASTATIN (CRESTOR) 10 MG TABLET    Take 10 mg by mouth daily.   SERTRALINE (ZOLOFT) 100 MG TABLET    Take 100 mg by mouth daily.  Modified Medications   No medications on file  Discontinued Medications   METFORMIN (GLUCOPHAGE) 500 MG TABLET    Take 1,000 mg by mouth 2 (two) times daily.    Allergies No Known Allergies  Past Medical History Past Medical History:  Diagnosis Date   Arthritis    Diabetes mellitus without complication (HCC)    Hypertension    Nystagmus    PONV (postoperative nausea and vomiting)    per pt vomited after receiving Dilaudid   Vertigo     Past Surgical History Past Surgical History:  Procedure Laterality Date   ADENOIDECTOMY     bilateral knee arthroscopic  R 6 years ago L June 2013   CERVICAL FUSION  2011   C2 C3 C4   KNEE ARTHROSCOPY  10/15/2012   Procedure: ARTHROSCOPY KNEE;  Surgeon: [name], MD;  Location: [facility];  Service: Orthopedics;  Laterality: Left;   Left foot surgery  Dec. 2011   TONSILLECTOMY     TOTAL KNEE ARTHROPLASTY  10/15/2012   Procedure: TOTAL KNEE ARTHROPLASTY;  Surgeon: [name], MD;  Location: [facility];  Service: Orthopedics;  Laterality: Right;   TOTAL KNEE ARTHROPLASTY Left 06/13/2016   Procedure: LEFT TOTAL KNEE ARTHROPLASTY;  Surgeon: Durene Romans, MD;  Location: [facility];  Service: Orthopedics;  Laterality: Left;    Family History family history includes Cancer in her brother; Heart disease in her father.  Social History Social History   Socioeconomic History   Marital status: Married    Spouse name: Chrissie Noa   Number of children: 0   Years of education: Not on file   Highest education level: Bachelor's  degree (e.g., BA, AB, BS)  Occupational History    Comment: supervisor  Tobacco Use   Smoking status: Never   Smokeless tobacco: Never  Substance and Sexual Activity   Alcohol use: No    Comment: rare   Drug use: No   Sexual activity: Not on file  Other Topics  Concern   Not on file  Social History Narrative   Lives with husband   Caffeine- coffee 1 cup daily   Social Drivers of Health   Financial Resource Strain: Not on file  Food Insecurity: Not on file  Transportation Needs: Not on file  Physical Activity: Not on file  Stress: Not on file  Social Connections: Not on file  Intimate Partner Violence: Not on file    Lab Results  Component Value Date   HGBA1C 10.4 (H) 06/26/2023   Lab Results  Component Value Date   CHOL 113 06/26/2023   Lab Results  Component Value Date   HDL 43.00 06/26/2023   Lab Results  Component Value Date   LDLCALC 34 06/26/2023   Lab Results  Component Value Date   TRIG 182.0 (H) 06/26/2023   Lab Results  Component Value Date   CHOLHDL 3 06/26/2023   Lab Results  Component Value Date   CREATININE 0.72 06/26/2023   Lab Results  Component Value Date   GFR 86.59 06/26/2023   Lab Results  Component Value Date   MICROALBUR 2.4 (H) 06/26/2023      Component Value Date/Time   NA 137 06/26/2023 0921   K 4.4 06/26/2023 0921   CL 98 06/26/2023 0921   CO2 29 06/26/2023 0921   GLUCOSE 305 (H) 06/26/2023 0921   BUN 16 06/26/2023 0921   CREATININE 0.72 06/26/2023 0921   CALCIUM 9.9 06/26/2023 0921   PROT 6.6 06/26/2023 0921   ALBUMIN 4.0 06/26/2023 0921   AST 13 06/26/2023 0921   ALT 14 06/26/2023 0921   ALKPHOS 70 06/26/2023 0921   BILITOT 0.3 06/26/2023 0921   GFRNONAA >60 06/14/2016 0413   GFRAA >60 06/14/2016 0413      Latest Ref Rng & Units 06/26/2023    9:21 AM 06/14/2016    4:13 AM 10/17/2012    4:54 AM  BMP  Glucose 70 - 99 mg/dL 213  086  578   BUN 6 - 23 mg/dL 16  17  11    Creatinine 0.40 - 1.20 mg/dL 4.69  6.29  5.28   Sodium 135 - 145 mEq/L 137  138  136   Potassium 3.5 - 5.1 mEq/L 4.4  4.9  4.0   Chloride 96 - 112 mEq/L 98  106  102   CO2 19 - 32 mEq/L 29  26  30    Calcium 8.4 - 10.5 mg/dL 9.9  8.2  8.6        Component Value Date/Time   WBC 14.0 (H) 06/14/2016 0413    RBC 3.29 (L) 06/14/2016 0413   HGB 10.6 (L) 06/14/2016 0413   HCT 31.9 (L) 06/14/2016 0413   PLT 290 06/14/2016 0413   MCV 97.0 06/14/2016 0413   MCH 32.2 06/14/2016 0413   MCHC 33.2 06/14/2016 0413   RDW 14.4 06/14/2016 0413     Parts of this note may have been dictated using voice recognition software. There may be variances in spelling and vocabulary which are unintentional. Not all errors are proofread. Please notify the There[facility] Parkin if any discrepancies are noted or if the meaning of any statement is not clear.

## 2023-11-12 NOTE — Patient Instructions (Signed)

## 2023-11-15 ENCOUNTER — Telehealth: Payer: Self-pay

## 2023-11-15 MED ORDER — METFORMIN HCL ER 500 MG PO TB24: 1000.0000 mg | ORAL_TABLET | Freq: Two times a day (BID) | ORAL | 3 refills | Status: DC

## 2023-11-15 NOTE — Telephone Encounter (Signed)
Patient states that rx for slow release metformin was sent in for 500 mg twice daily and she was told it would stay at 1000 mg twice daily. Please advise.

## 2023-12-04 ENCOUNTER — Encounter: Payer: Self-pay | Admitting: "Endocrinology

## 2023-12-04 ENCOUNTER — Ambulatory Visit: Payer: Medicare Other | Admitting: "Endocrinology

## 2023-12-04 VITALS — BP 142/80 | HR 83 | Resp 20 | Ht 61.0 in | Wt 196.0 lb

## 2023-12-04 DIAGNOSIS — Z7984 Long term (current) use of oral hypoglycemic drugs: Secondary | ICD-10-CM

## 2023-12-04 DIAGNOSIS — Z7985 Long-term (current) use of injectable non-insulin antidiabetic drugs: Secondary | ICD-10-CM

## 2023-12-04 DIAGNOSIS — E11649 Type 2 diabetes mellitus with hypoglycemia without coma: Secondary | ICD-10-CM | POA: Diagnosis not present

## 2023-12-04 DIAGNOSIS — E782 Mixed hyperlipidemia: Secondary | ICD-10-CM | POA: Diagnosis not present

## 2023-12-04 DIAGNOSIS — E119 Type 2 diabetes mellitus without complications: Secondary | ICD-10-CM

## 2023-12-04 LAB — POCT GLYCOSYLATED HEMOGLOBIN (HGB A1C): Hemoglobin A1C: 6.5 % — AB (ref 4.0–5.6)

## 2023-12-04 MED ORDER — METFORMIN HCL ER 500 MG PO TB24: 1000.0000 mg | ORAL_TABLET | Freq: Two times a day (BID) | ORAL | 3 refills | Status: DC

## 2023-12-04 MED ORDER — SEMAGLUTIDE (1 MG/DOSE) 4 MG/3ML ~~LOC~~ SOPN: 1.0000 mg | PEN_INJECTOR | SUBCUTANEOUS | 0 refills | Status: AC

## 2023-12-04 NOTE — Patient Instructions (Signed)

## 2023-12-04 NOTE — Progress Notes (Signed)
 Outpatient Endocrinology Note Betty Birmingham, MD  12/04/23   Betty Robinson Side 21-Sep-1956 [id number]  Referring Provider: No ref. provider found Primary Care Provider: Gwenn Norris, MD (Inactive) Reason for consultation: Subjective   Assessment & Plan  Diagnoses and all orders for this visit:  Controlled type 2 diabetes mellitus without complication, without long-term current use of insulin  (HCC) -     POCT glycosylated hemoglobin (Hb A1C) -     Semaglutide , 1 MG/DOSE, 4 MG/3ML SOPN; Inject 1 mg as directed once a week. -     metFORMIN  (GLUCOPHAGE -XR) 500 MG 24 hr tablet; Take 2 tablets (1,000 mg total) by mouth 2 (two) times daily with a meal.  Long term (current) use of oral hypoglycemic drugs  Long-term (current) use of injectable non-insulin  antidiabetic drugs  Mixed hypercholesterolemia and hypertriglyceridemia   Diabetes Type II complicated by hyperglycemia Lab Results  Component Value Date   GFR 86.59 06/26/2023   Hba1c goal less than 7, current Hba1c is  Lab Results  Component Value Date   HGBA1C 6.5 (A) 12/04/2023   Will recommend the following: Ozempic  1 mg weekly (decreased from 2 mg due to BG <70 and patient having GI S/E, although may not be related to meds given that patient has been stable on these medication doses for 2 mo) Metformin  500 XR mg 1-3 pills a day to avoid GI S/E (cannot tolerate 4 pills a day) Lantus  48 units every day  No known contraindications/side effects to any of above medications Patient prefers ozempic  over Trulicity   -Last LD and Tg are as follows: Lab Results  Component Value Date   LDLCALC 34 06/26/2023    Lab Results  Component Value Date   TRIG 182.0 (H) 06/26/2023   -On rosuvastatin 10 mg QD -Follow low fat diet and exercise   -Blood pressure goal <140/90 - Microalbumin/creatinine goal is < 30 -Last MA/Cr is as follows: Lab Results  Component Value Date   MICROALBUR 2.4 (H) 06/26/2023   -on ACE/ARB  quinapril  10 mg qd -diet changes including salt restriction -limit eating outside -counseled BP targets per standards of diabetes care -uncontrolled blood pressure can lead to retinopathy, nephropathy and cardiovascular and atherosclerotic heart disease  Reviewed and counseled on: -A1C target -Blood sugar targets -Complications of uncontrolled diabetes  -Checking blood sugar before meals and bedtime and bring log next visit -All medications with mechanism of action and side effects -Hypoglycemia management: rule of 15's, Glucagon  Emergency Kit and medical alert ID -low-carb low-fat plate-method diet -At least 20 minutes of physical activity per day -Annual dilated retinal eye exam and foot exam -compliance and follow up needs -follow up as scheduled or earlier if problem gets worse  Call if blood sugar is less than 70 or consistently above 250    Take a 15 gm snack of carbohydrate at bedtime before you go to sleep if your blood sugar is less than 100.    If you are going to fast after midnight for a test or procedure, ask your physician for instructions on how to reduce/decrease your insulin  dose.    Call if blood sugar is less than 70 or consistently above 250  -Treating a low sugar by rule of 15  (15 gms of sugar every 15 min until sugar is more than 70) If you feel your sugar is low, test your sugar to be sure If your sugar is low (less than 70), then take 15 grams of a fast acting Carbohydrate (  3-4 glucose tablets or glucose gel or 4 ounces of juice or regular soda) Recheck your sugar 15 min after treating low to make sure it is more than 70 If sugar is still less than 70, treat again with 15 grams of carbohydrate          Don't drive the hour of hypoglycemia  If unconscious/unable to eat or drink by mouth, use glucagon  injection or nasal spray baqsimi  and call 911. Can repeat again in 15 min if still unconscious.  Return in about 4 weeks (around 01/01/2024).   I have  reviewed current medications, nurse's notes, allergies, vital signs, past medical and surgical history, family medical history, and social history for this encounter. Counseled patient on symptoms, examination findings, lab findings, imaging results, treatment decisions and monitoring and prognosis. The patient understood the recommendations and agrees with the treatment plan. All questions regarding treatment plan were fully answered.  Betty Birmingham, MD  12/04/23    History of Present Illness Betty Robinson is a 68 y.o. year old female who presents for follow up of Type II diabetes mellitus.  TAYLOUR LIETZKE was first diagnosed in 2014.   Diabetes education +  Currently on: Lantus  48 units every day  Ozempic  1 mg weekly Metformin  XR 500 mg 2 pills bid  No known contraindications/side effects to any of above medications Patient prefers ozempic  over Trulicity   COMPLICATIONS -  MI/Stroke -  retinopathy -  neuropathy -  nephropathy  BLOOD SUGAR DATA  CGM interpretation: At today's visit, we reviewed her CGM downloads. The full report is scanned in the media. Reviewing the CGM trends, BG are well controlled most of the day.    Physical Exam  BP (!) 142/80 (BP Location: Left Arm, Patient Position: Sitting, Cuff Size: Large)   Pulse 83   Resp 20   Ht 5' 1 (1.549 m)   Wt 196 lb (88.9 kg)   SpO2 94%   BMI 37.03 kg/m    Constitutional: well developed, well nourished Head: normocephalic, atraumatic Eyes: sclera anicteric, no redness Neck: supple Lungs: normal respiratory effort Neurology: alert and oriented Skin: dry, no appreciable rashes Musculoskeletal: no appreciable defects Psychiatric: normal mood and affect Diabetic Foot Exam - Simple   No data filed      Current Medications Patient's Medications  New Prescriptions   No medications on file  Previous Medications   B COMPLEX VITAMINS TABLET    Take 1 tablet by mouth daily.   CALCIUM CARBONATE (OS-CAL) 600 MG TABS     Take 1,200 mg by mouth 2 (two) times daily with a meal.   CELECOXIB  (CELEBREX ) 200 MG CAPSULE    200 mg daily.   CONTINUOUS GLUCOSE SENSOR (DEXCOM G7 SENSOR) MISC    1 Device by Does not apply route continuous.   FISH OIL-OMEGA-3 FATTY ACIDS 1000 MG CAPSULE    Take 1 g by mouth daily.   GLUCOSAMINE-CHONDROITIN 500-400 MG TABLET    Take 2 tablets by mouth daily at 6 (six) AM.   INSULIN  GLARGINE (LANTUS  SOLOSTAR) 100 UNIT/ML SOLOSTAR PEN    Inject 50 Units into the skin daily.   LISINOPRIL  (ZESTRIL ) 20 MG TABLET    Take 20 mg by mouth daily.   LORATADINE (CLARITIN) 10 MG TABLET    Take 10 mg by mouth daily.   MAGNESIUM  250 MG TABS    Take 1 tablet by mouth daily.   MULTIPLE VITAMIN (MULTIVITAMIN WITH MINERALS) TABS    Take 1 tablet  by mouth daily.   ONDANSETRON  (ZOFRAN -ODT) 8 MG DISINTEGRATING TABLET    Take 8 mg by mouth every 8 (eight) hours as needed for nausea.   QUINAPRIL -HYDROCHLOROTHIAZIDE  (ACCURETIC ) 10-12.5 MG PER TABLET    Take 1 tablet by mouth daily before breakfast.   ROSUVASTATIN (CRESTOR) 10 MG TABLET    Take 10 mg by mouth daily.   SERTRALINE (ZOLOFT) 100 MG TABLET    Take 100 mg by mouth daily.  Modified Medications   Modified Medication Previous Medication   METFORMIN  (GLUCOPHAGE -XR) 500 MG 24 HR TABLET metFORMIN  (GLUCOPHAGE -XR) 500 MG 24 hr tablet      Take 2 tablets (1,000 mg total) by mouth 2 (two) times daily with a meal.    Take 2 tablets (1,000 mg total) by mouth 2 (two) times daily with a meal.   SEMAGLUTIDE , 1 MG/DOSE, 4 MG/3ML SOPN Semaglutide , 1 MG/DOSE, 4 MG/3ML SOPN      Inject 1 mg as directed once a week.    Inject 1 mg as directed once a week.  Discontinued Medications   No medications on file    Allergies No Known Allergies  Past Medical History Past Medical History:  Diagnosis Date   Arthritis    Diabetes mellitus without complication (HCC)    Hypertension    Nystagmus    PONV (postoperative nausea and vomiting)    per pt vomited after receiving  Dilaudid    Vertigo     Past Surgical History Past Surgical History:  Procedure Laterality Date   ADENOIDECTOMY     bilateral knee arthroscopic  R 6 years ago L June 2013   CERVICAL FUSION  2011   C2 C3 C4   KNEE ARTHROSCOPY  10/15/2012   Procedure: ARTHROSCOPY KNEE;  Surgeon: Donnice JONETTA Car, MD;  Location: [facility];  Service: Orthopedics;  Laterality: Left;   Left foot surgery  Dec. 2011   TONSILLECTOMY     TOTAL KNEE ARTHROPLASTY  10/15/2012   Procedure: TOTAL KNEE ARTHROPLASTY;  Surgeon: Donnice JONETTA Car, MD;  Location: [facility];  Service: Orthopedics;  Laterality: Right;   TOTAL KNEE ARTHROPLASTY Left 06/13/2016   Procedure: LEFT TOTAL KNEE ARTHROPLASTY;  Surgeon: Donnice Car, MD;  Location: [facility];  Service: Orthopedics;  Laterality: Left;    Family History family history includes Cancer in her brother; Heart disease in her father.  Social History Social History   Socioeconomic History   Marital status: Married    Spouse name: Elsie   Number of children: 0   Years of education: Not on file   Highest education level: Bachelor's degree (e.g., BA, AB, BS)  Occupational History    Comment: supervisor  Tobacco Use   Smoking status: Never   Smokeless tobacco: Never  Substance and Sexual Activity   Alcohol use: No    Comment: rare   Drug use: No   Sexual activity: Not on file  Other Topics Concern   Not on file  Social History Narrative   Lives with husband   Caffeine- coffee 1 cup daily   Social Drivers of Health   Financial Resource Strain: Not on file  Food Insecurity: Not on file  Transportation Needs: Not on file  Physical Activity: Not on file  Stress: Not on file  Social Connections: Not on file  Intimate Partner Violence: Not on file    Lab Results  Component Value Date   HGBA1C 6.5 (A) 12/04/2023   HGBA1C 10.4 (H) 06/26/2023   Lab Results  Component Value Date  CHOL 113 06/26/2023   Lab Results  Component Value Date   HDL 43.00 06/26/2023    Lab Results  Component Value Date   LDLCALC 34 06/26/2023   Lab Results  Component Value Date   TRIG 182.0 (H) 06/26/2023   Lab Results  Component Value Date   CHOLHDL 3 06/26/2023   Lab Results  Component Value Date   CREATININE 0.72 06/26/2023   Lab Results  Component Value Date   GFR 86.59 06/26/2023   Lab Results  Component Value Date   MICROALBUR 2.4 (H) 06/26/2023      Component Value Date/Time   NA 137 06/26/2023 0921   K 4.4 06/26/2023 0921   CL 98 06/26/2023 0921   CO2 29 06/26/2023 0921   GLUCOSE 305 (H) 06/26/2023 0921   BUN 16 06/26/2023 0921   CREATININE 0.72 06/26/2023 0921   CALCIUM 9.9 06/26/2023 0921   PROT 6.6 06/26/2023 0921   ALBUMIN 4.0 06/26/2023 0921   AST 13 06/26/2023 0921   ALT 14 06/26/2023 0921   ALKPHOS 70 06/26/2023 0921   BILITOT 0.3 06/26/2023 0921   GFRNONAA >60 06/14/2016 0413   GFRAA >60 06/14/2016 0413      Latest Ref Rng & Units 06/26/2023    9:21 AM 06/14/2016    4:13 AM 10/17/2012    4:54 AM  BMP  Glucose 70 - 99 mg/dL 694  865  886   BUN 6 - 23 mg/dL 16  17  11    Creatinine 0.40 - 1.20 mg/dL 9.27  9.40  9.34   Sodium 135 - 145 mEq/L 137  138  136   Potassium 3.5 - 5.1 mEq/L 4.4  4.9  4.0   Chloride 96 - 112 mEq/L 98  106  102   CO2 19 - 32 mEq/L 29  26  30    Calcium 8.4 - 10.5 mg/dL 9.9  8.2  8.6        Component Value Date/Time   WBC 14.0 (H) 06/14/2016 0413   RBC 3.29 (L) 06/14/2016 0413   HGB 10.6 (L) 06/14/2016 0413   HCT 31.9 (L) 06/14/2016 0413   PLT 290 06/14/2016 0413   MCV 97.0 06/14/2016 0413   MCH 32.2 06/14/2016 0413   MCHC 33.2 06/14/2016 0413   RDW 14.4 06/14/2016 0413     Parts of this note may have been dictated using voice recognition software. There may be variances in spelling and vocabulary which are unintentional. Not all errors are proofread. Please notify the dino if any discrepancies are noted or if the meaning of any statement is not clear.   Lantus  48 units once every  morning Ozempic  2 mg weekly  Metformin  500 mg 2 pills bid  No known contraindications/side effects to any of above medications Patient prefers ozempic  over Trulicity   -Last LD and Tg are as follows: Lab Results  Component Value Date   LDLCALC 34 06/26/2023    Lab Results  Component Value Date   TRIG 182.0 (H) 06/26/2023   -On rosuvastatin 10 mg QD -Follow low fat diet and exercise   -Blood pressure goal <140/90 - Microalbumin/creatinine goal is < 30 -Last MA/Cr is as follows: Lab Results  Component Value Date   MICROALBUR 2.4 (H) 06/26/2023   -on ACE/ARB lisinopril  20 mg qd -diet changes including salt restriction -limit eating outside -counseled BP targets per standards of diabetes care -uncontrolled blood pressure can lead to retinopathy, nephropathy and cardiovascular and atherosclerotic heart disease  Reviewed and counseled  on: -A1C target -Blood sugar targets -Complications of uncontrolled diabetes  -Checking blood sugar before meals and bedtime and bring log next visit -All medications with mechanism of action and side effects -Hypoglycemia management: rule of 15's, Glucagon  Emergency Kit and medical alert ID -low-carb low-fat plate-method diet -At least 20 minutes of physical activity per day -Annual dilated retinal eye exam and foot exam -compliance and follow up needs -follow up as scheduled or earlier if problem gets worse  Call if blood sugar is less than 70 or consistently above 250    Take a 15 gm snack of carbohydrate at bedtime before you go to sleep if your blood sugar is less than 100.    If you are going to fast after midnight for a test or procedure, ask your physician for instructions on how to reduce/decrease your insulin  dose.    Call if blood sugar is less than 70 or consistently above 250  -Treating a low sugar by rule of 15  (15 gms of sugar every 15 min until sugar is more than 70) If you feel your sugar is low, test your sugar to be  sure If your sugar is low (less than 70), then take 15 grams of a fast acting Carbohydrate (3-4 glucose tablets or glucose gel or 4 ounces of juice or regular soda) Recheck your sugar 15 min after treating low to make sure it is more than 70 If sugar is still less than 70, treat again with 15 grams of carbohydrate          Don't drive the hour of hypoglycemia  If unconscious/unable to eat or drink by mouth, use glucagon  injection or nasal spray baqsimi  and call 911. Can repeat again in 15 min if still unconscious.  Return in about 4 weeks (around 01/01/2024).   I have reviewed current medications, nurse's notes, allergies, vital signs, past medical and surgical history, family medical history, and social history for this encounter. Counseled patient on symptoms, examination findings, lab findings, imaging results, treatment decisions and monitoring and prognosis. The patient understood the recommendations and agrees with the treatment plan. All questions regarding treatment plan were fully answered.  Betty Birmingham, MD  12/04/23    History of Present Illness Betty Robinson is a 68 y.o. year old female who presents for evaluation of Type II diabetes mellitus.  HONESTEE REVARD was first diagnosed in 2014.   Diabetes education +  Was on ozempic  but couldn't get it.  Home diabetes regimen: Ozempic  2 mg weekly  Metformin  500 mg 2 pills bid  Previously on trulicity, pt likes ozempic  better   COMPLICATIONS -  MI/Stroke -  retinopathy -  neuropathy -  nephropathy  SYMPTOMS REVIEWED + Polyuria - Weight loss - Blurred vision  BLOOD SUGAR DATA Checks fasting, BG 83-153   Physical Exam  BP (!) 142/80 (BP Location: Left Arm, Patient Position: Sitting, Cuff Size: Large)   Pulse 83   Resp 20   Ht 5' 1 (1.549 m)   Wt 196 lb (88.9 kg)   SpO2 94%   BMI 37.03 kg/m    Constitutional: well developed, well nourished Head: normocephalic, atraumatic Eyes: sclera anicteric, no  redness Neck: supple Lungs: normal respiratory effort Neurology: alert and oriented Skin: dry, no appreciable rashes Musculoskeletal: no appreciable defects Psychiatric: normal mood and affect Diabetic Foot Exam - Simple   No data filed      Current Medications Patient's Medications  New Prescriptions   No medications on file  Previous Medications   B COMPLEX VITAMINS TABLET    Take 1 tablet by mouth daily.   CALCIUM CARBONATE (OS-CAL) 600 MG TABS    Take 1,200 mg by mouth 2 (two) times daily with a meal.   CELECOXIB  (CELEBREX ) 200 MG CAPSULE    200 mg daily.   CONTINUOUS GLUCOSE SENSOR (DEXCOM G7 SENSOR) MISC    1 Device by Does not apply route continuous.   FISH OIL-OMEGA-3 FATTY ACIDS 1000 MG CAPSULE    Take 1 g by mouth daily.   GLUCOSAMINE-CHONDROITIN 500-400 MG TABLET    Take 2 tablets by mouth daily at 6 (six) AM.   INSULIN  GLARGINE (LANTUS  SOLOSTAR) 100 UNIT/ML SOLOSTAR PEN    Inject 50 Units into the skin daily.   LISINOPRIL  (ZESTRIL ) 20 MG TABLET    Take 20 mg by mouth daily.   LORATADINE (CLARITIN) 10 MG TABLET    Take 10 mg by mouth daily.   MAGNESIUM  250 MG TABS    Take 1 tablet by mouth daily.   MULTIPLE VITAMIN (MULTIVITAMIN WITH MINERALS) TABS    Take 1 tablet by mouth daily.   ONDANSETRON  (ZOFRAN -ODT) 8 MG DISINTEGRATING TABLET    Take 8 mg by mouth every 8 (eight) hours as needed for nausea.   QUINAPRIL -HYDROCHLOROTHIAZIDE  (ACCURETIC ) 10-12.5 MG PER TABLET    Take 1 tablet by mouth daily before breakfast.   ROSUVASTATIN (CRESTOR) 10 MG TABLET    Take 10 mg by mouth daily.   SERTRALINE (ZOLOFT) 100 MG TABLET    Take 100 mg by mouth daily.  Modified Medications   Modified Medication Previous Medication   METFORMIN  (GLUCOPHAGE -XR) 500 MG 24 HR TABLET metFORMIN  (GLUCOPHAGE -XR) 500 MG 24 hr tablet      Take 2 tablets (1,000 mg total) by mouth 2 (two) times daily with a meal.    Take 2 tablets (1,000 mg total) by mouth 2 (two) times daily with a meal.   SEMAGLUTIDE , 1  MG/DOSE, 4 MG/3ML SOPN Semaglutide , 1 MG/DOSE, 4 MG/3ML SOPN      Inject 1 mg as directed once a week.    Inject 1 mg as directed once a week.  Discontinued Medications   No medications on file    Allergies No Known Allergies  Past Medical History Past Medical History:  Diagnosis Date   Arthritis    Diabetes mellitus without complication (HCC)    Hypertension    Nystagmus    PONV (postoperative nausea and vomiting)    per pt vomited after receiving Dilaudid    Vertigo     Past Surgical History Past Surgical History:  Procedure Laterality Date   ADENOIDECTOMY     bilateral knee arthroscopic  R 6 years ago L June 2013   CERVICAL FUSION  2011   C2 C3 C4   KNEE ARTHROSCOPY  10/15/2012   Procedure: ARTHROSCOPY KNEE;  Surgeon: Donnice JONETTA Car, MD;  Location: [facility];  Service: Orthopedics;  Laterality: Left;   Left foot surgery  Dec. 2011   TONSILLECTOMY     TOTAL KNEE ARTHROPLASTY  10/15/2012   Procedure: TOTAL KNEE ARTHROPLASTY;  Surgeon: Donnice JONETTA Car, MD;  Location: [facility];  Service: Orthopedics;  Laterality: Right;   TOTAL KNEE ARTHROPLASTY Left 06/13/2016   Procedure: LEFT TOTAL KNEE ARTHROPLASTY;  Surgeon: Donnice Car, MD;  Location: [facility];  Service: Orthopedics;  Laterality: Left;    Family History family history includes Cancer in her brother; Heart disease in her father.  Social History Social History  Socioeconomic History   Marital status: Married    Spouse name: Elsie   Number of children: 0   Years of education: Not on file   Highest education level: Bachelor's degree (e.g., BA, AB, BS)  Occupational History    Comment: supervisor  Tobacco Use   Smoking status: Never   Smokeless tobacco: Never  Substance and Sexual Activity   Alcohol use: No    Comment: rare   Drug use: No   Sexual activity: Not on file  Other Topics Concern   Not on file  Social History Narrative   Lives with husband   Caffeine- coffee 1 cup daily   Social Drivers of Health    Financial Resource Strain: Not on file  Food Insecurity: Not on file  Transportation Needs: Not on file  Physical Activity: Not on file  Stress: Not on file  Social Connections: Not on file  Intimate Partner Violence: Not on file    Lab Results  Component Value Date   HGBA1C 6.5 (A) 12/04/2023   HGBA1C 10.4 (H) 06/26/2023   Lab Results  Component Value Date   CHOL 113 06/26/2023   Lab Results  Component Value Date   HDL 43.00 06/26/2023   Lab Results  Component Value Date   LDLCALC 34 06/26/2023   Lab Results  Component Value Date   TRIG 182.0 (H) 06/26/2023   Lab Results  Component Value Date   CHOLHDL 3 06/26/2023   Lab Results  Component Value Date   CREATININE 0.72 06/26/2023   Lab Results  Component Value Date   GFR 86.59 06/26/2023   Lab Results  Component Value Date   MICROALBUR 2.4 (H) 06/26/2023      Component Value Date/Time   NA 137 06/26/2023 0921   K 4.4 06/26/2023 0921   CL 98 06/26/2023 0921   CO2 29 06/26/2023 0921   GLUCOSE 305 (H) 06/26/2023 0921   BUN 16 06/26/2023 0921   CREATININE 0.72 06/26/2023 0921   CALCIUM 9.9 06/26/2023 0921   PROT 6.6 06/26/2023 0921   ALBUMIN 4.0 06/26/2023 0921   AST 13 06/26/2023 0921   ALT 14 06/26/2023 0921   ALKPHOS 70 06/26/2023 0921   BILITOT 0.3 06/26/2023 0921   GFRNONAA >60 06/14/2016 0413   GFRAA >60 06/14/2016 0413      Latest Ref Rng & Units 06/26/2023    9:21 AM 06/14/2016    4:13 AM 10/17/2012    4:54 AM  BMP  Glucose 70 - 99 mg/dL 694  865  886   BUN 6 - 23 mg/dL 16  17  11    Creatinine 0.40 - 1.20 mg/dL 9.27  9.40  9.34   Sodium 135 - 145 mEq/L 137  138  136   Potassium 3.5 - 5.1 mEq/L 4.4  4.9  4.0   Chloride 96 - 112 mEq/L 98  106  102   CO2 19 - 32 mEq/L 29  26  30    Calcium 8.4 - 10.5 mg/dL 9.9  8.2  8.6        Component Value Date/Time   WBC 14.0 (H) 06/14/2016 0413   RBC 3.29 (L) 06/14/2016 0413   HGB 10.6 (L) 06/14/2016 0413   HCT 31.9 (L) 06/14/2016 0413   PLT  290 06/14/2016 0413   MCV 97.0 06/14/2016 0413   MCH 32.2 06/14/2016 0413   MCHC 33.2 06/14/2016 0413   RDW 14.4 06/14/2016 0413     Parts of this note may have been dictated  using voice recognition software. There may be variances in spelling and vocabulary which are unintentional. Not all errors are proofread. Please notify the dino if any discrepancies are noted or if the meaning of any statement is not clear.

## 2023-12-05 ENCOUNTER — Encounter: Payer: Self-pay | Admitting: "Endocrinology

## 2024-01-02 ENCOUNTER — Ambulatory Visit: Payer: Medicare Other | Admitting: "Endocrinology

## 2024-01-09 ENCOUNTER — Other Ambulatory Visit: Payer: Self-pay | Admitting: "Endocrinology

## 2024-01-09 DIAGNOSIS — Z794 Long term (current) use of insulin: Secondary | ICD-10-CM

## 2024-01-15 ENCOUNTER — Other Ambulatory Visit: Payer: Self-pay

## 2024-01-15 MED ORDER — LANTUS SOLOSTAR 100 UNIT/ML ~~LOC~~ SOPN: 48.0000 [IU] | PEN_INJECTOR | Freq: Every day | SUBCUTANEOUS | 1 refills | Status: AC

## 2024-02-04 ENCOUNTER — Ambulatory Visit: Admitting: "Endocrinology

## 2024-02-04 ENCOUNTER — Encounter: Payer: Self-pay | Admitting: "Endocrinology

## 2024-02-04 VITALS — BP 130/80 | HR 82 | Ht 61.0 in | Wt 196.0 lb

## 2024-02-04 DIAGNOSIS — E119 Type 2 diabetes mellitus without complications: Secondary | ICD-10-CM

## 2024-02-04 DIAGNOSIS — Z7984 Long term (current) use of oral hypoglycemic drugs: Secondary | ICD-10-CM

## 2024-02-04 DIAGNOSIS — E782 Mixed hyperlipidemia: Secondary | ICD-10-CM

## 2024-02-04 DIAGNOSIS — Z7985 Long-term (current) use of injectable non-insulin antidiabetic drugs: Secondary | ICD-10-CM | POA: Diagnosis not present

## 2024-02-04 MED ORDER — TIRZEPATIDE 2.5 MG/0.5ML ~~LOC~~ SOAJ: 2.5000 mg | SUBCUTANEOUS | 0 refills | Status: AC

## 2024-02-04 MED ORDER — BAQSIMI ONE PACK 3 MG/DOSE NA POWD: 1.0000 | NASAL | 3 refills | Status: AC | PRN

## 2024-02-04 NOTE — Progress Notes (Signed)
 Outpatient Endocrinology Note Betty [place], MD  02/04/24   Betty Robinson 08/13/1956 [id number]  Referring Provider: No ref. provider found Primary Care Provider: Juluis Rainier, MD (Inactive) Reason for consultation: Subjective   Assessment & Plan  Diagnoses and all orders for this visit:  Controlled type 2 diabetes mellitus without complication, without long-term current use of insulin (HCC)  Long term (current) use of oral hypoglycemic drugs  Long-term (current) use of injectable non-insulin antidiabetic drugs  Mixed hypercholesterolemia and hypertriglyceridemia  Other orders -     tirzepatide [facility]) 2.5 MG/0.5ML Pen; Inject 2.5 mg into the skin once a week. -     Glucagon (BAQSIMI ONE PACK) 3 MG/DOSE POWD; Place 1 Device into the nose as needed (Low blood sugar with impaired consciousness).    Diabetes Type II complicated by hyperglycemia Lab Results  Component Value Date   GFR 86.59 06/26/2023   Hba1c goal less than 7, current Hba1c is  Lab Results  Component Value Date   HGBA1C 6.5 (A) 12/04/2023   Will recommend the following: Ozempic 1 mg weekly (decreased from 2 mg due to BG <70 and patient having GI S/E, although may not be related to meds given that patient has been stable on these medication doses for 2 mo) Metformin 500 XR mg 1-3 pills a day to avoid GI S/E (cannot tolerate 4 pills a day) Lantus 48 units every day Ordered mounajro to cost assess-patient says no weight benefit from ozempic Patient prefers ozempic over Trulicity   No known contraindications/side effects to any of above medications Baqsimi ordered 02/04/24  -Last LD and Tg are as follows: Lab Results  Component Value Date   LDLCALC 34 06/26/2023    Lab Results  Component Value Date   TRIG 182.0 (H) 06/26/2023   -On rosuvastatin 10 mg QD -Follow low fat diet and exercise   -Blood pressure goal <140/90 - Microalbumin/creatinine goal is < 30 -Last MA/Cr is as  follows: Lab Results  Component Value Date   MICROALBUR 2.4 (H) 06/26/2023   -on ACE/ARB lisinopril 20 mg qd -diet changes including salt restriction -limit eating outside -counseled BP targets per standards of diabetes care -uncontrolled blood pressure can lead to retinopathy, nephropathy and cardiovascular and atherosclerotic heart disease  Reviewed and counseled on: -A1C target -Blood sugar targets -Complications of uncontrolled diabetes  -Checking blood sugar before meals and bedtime and bring log next visit -All medications with mechanism of action and side effects -Hypoglycemia management: rule of 15's, Glucagon Emergency Kit and medical alert ID -low-carb low-fat plate-method diet -At least 20 minutes of physical activity per day -Annual dilated retinal eye exam and foot exam -compliance and follow up needs -follow up as scheduled or earlier if problem gets worse  Call if blood sugar is less than 70 or consistently above 250    Take a 15 gm snack of carbohydrate at bedtime before you go to sleep if your blood sugar is less than 100.    If you are going to fast after midnight for a test or procedure, ask your physician for instructions on how to reduce/decrease your insulin dose.    Call if blood sugar is less than 70 or consistently above 250  -Treating a low sugar by rule of 15  (15 gms of sugar every 15 min until sugar is more than 70) If you feel your sugar is low, test your sugar to be sure If your sugar is low (less than 70), then take 15  grams of a fast acting Carbohydrate (3-4 glucose tablets or glucose gel or 4 ounces of juice or regular soda) Recheck your sugar 15 min after treating low to make sure it is more than 70 If sugar is still less than 70, treat again with 15 grams of carbohydrate          Don't drive the hour of hypoglycemia  If unconscious/unable to eat or drink by mouth, use glucagon injection or nasal spray baqsimi and call 911. Can repeat again in  15 min if still unconscious.  Return in about 5 weeks (around 03/10/2024).   I have reviewed current medications, nurse's notes, allergies, vital signs, past medical and surgical history, family medical history, and social history for this encounter. Counseled patient on symptoms, examination findings, lab findings, imaging results, treatment decisions and monitoring and prognosis. The patient understood the recommendations and agrees with the treatment plan. All questions regarding treatment plan were fully answered.  Betty [place], MD  02/04/24    History of Present Illness Betty Robinson is a 68 y.o. year old female who presents for follow up of Type II diabetes mellitus.  Betty Robinson was first diagnosed in 2014.   Diabetes education +  Currently on: Lantus 50 units every day  Ozempic 1 mg weekly Metformin XR 500 mg 2 pills bid  No known contraindications/side effects to any of above medications Patient prefers ozempic over Trulicity   COMPLICATIONS -  MI/Stroke -  retinopathy -  neuropathy -  nephropathy  BLOOD SUGAR DATA  CGM interpretation: At today's visit, we reviewed her CGM downloads. The full report is scanned in the media. Reviewing the CGM trends, BG are well controlled with some overnight lows.    Physical Exam  BP 130/80   Pulse 82   Ht 5\' 1"  (1.549 m)   Wt 196 lb (88.9 kg)   SpO2 96%   BMI 37.03 kg/m    Constitutional: well developed, well nourished Head: normocephalic, atraumatic Eyes: sclera anicteric, no redness Neck: supple Lungs: normal respiratory effort Neurology: alert and oriented Skin: dry, no appreciable rashes Musculoskeletal: no appreciable defects Psychiatric: normal mood and affect Diabetic Foot Exam - Simple   No data filed      Current Medications Patient's Medications  New Prescriptions   GLUCAGON (BAQSIMI ONE PACK) 3 MG/DOSE POWD    Place 1 Device into the nose as needed (Low blood sugar with impaired consciousness).    TIRZEPATIDE (MOUNJARO) 2.5 MG/0.5ML PEN    Inject 2.5 mg into the skin once a week.  Previous Medications   B COMPLEX VITAMINS TABLET    Take 1 tablet by mouth daily.   CALCIUM CARBONATE (OS-CAL) 600 MG TABS    Take 1,200 mg by mouth 2 (two) times daily with a meal.   CELECOXIB (CELEBREX) 200 MG CAPSULE    200 mg daily.   CONTINUOUS GLUCOSE SENSOR (DEXCOM G7 SENSOR) MISC    1 Device by Does not apply route continuous.   FISH OIL-OMEGA-3 FATTY ACIDS 1000 MG CAPSULE    Take 1 g by mouth daily.   GLUCOSAMINE-CHONDROITIN 500-400 MG TABLET    Take 2 tablets by mouth daily at 6 (six) AM.   INSULIN GLARGINE (LANTUS SOLOSTAR) 100 UNIT/ML SOLOSTAR PEN    Inject 48 Units into the skin daily.   LISINOPRIL (ZESTRIL) 20 MG TABLET    Take 20 mg by mouth daily.   LORATADINE (CLARITIN) 10 MG TABLET    Take 10 mg by mouth  daily.   MAGNESIUM 250 MG TABS    Take 1 tablet by mouth daily.   METFORMIN (GLUCOPHAGE-XR) 500 MG 24 HR TABLET    Take 2 tablets (1,000 mg total) by mouth 2 (two) times daily with a meal.   MULTIPLE VITAMIN (MULTIVITAMIN WITH MINERALS) TABS    Take 1 tablet by mouth daily.   ONDANSETRON (ZOFRAN-ODT) 8 MG DISINTEGRATING TABLET    Take 8 mg by mouth every 8 (eight) hours as needed for nausea.   OZEMPIC, 2 MG/DOSE, 8 MG/3ML SOPN    INJECT 2MG  INTO THE SKIN ONCE A WEEK   QUINAPRIL-HYDROCHLOROTHIAZIDE (ACCURETIC) 10-12.5 MG PER TABLET    Take 1 tablet by mouth daily before breakfast.   ROSUVASTATIN (CRESTOR) 10 MG TABLET    Take 10 mg by mouth daily.   SEMAGLUTIDE, 1 MG/DOSE, 4 MG/3ML SOPN    Inject 1 mg as directed once a week.   SERTRALINE (ZOLOFT) 100 MG TABLET    Take 100 mg by mouth daily.  Modified Medications   No medications on file  Discontinued Medications   No medications on file    Allergies No Known Allergies  Past Medical History Past Medical History:  Diagnosis Date   Arthritis    Diabetes mellitus without complication (HCC)    Hypertension    Nystagmus    PONV  (postoperative nausea and vomiting)    per pt vomited after receiving Dilaudid   Vertigo     Past Surgical History Past Surgical History:  Procedure Laterality Date   ADENOIDECTOMY     bilateral knee arthroscopic  R 6 years ago L June 2013   CERVICAL FUSION  2011   C2 C3 C4   KNEE ARTHROSCOPY  10/15/2012   Procedure: ARTHROSCOPY KNEE;  Surgeon: [name], MD;  Location: [facility];  Service: Orthopedics;  Laterality: Left;   Left foot surgery  Dec. 2011   TONSILLECTOMY     TOTAL KNEE ARTHROPLASTY  10/15/2012   Procedure: TOTAL KNEE ARTHROPLASTY;  Surgeon: [name], MD;  Location: [facility];  Service: Orthopedics;  Laterality: Right;   TOTAL KNEE ARTHROPLASTY Left 06/13/2016   Procedure: LEFT TOTAL KNEE ARTHROPLASTY;  Surgeon: Durene Romans, MD;  Location: [facility];  Service: Orthopedics;  Laterality: Left;    Family History family history includes Cancer in her brother; Heart disease in her father.  Social History Social History   Socioeconomic History   Marital status: Married    Spouse name: Chrissie Noa   Number of children: 0   Years of education: Not on file   Highest education level: Bachelor's degree (e.g., BA, AB, BS)  Occupational History    Comment: supervisor  Tobacco Use   Smoking status: Never   Smokeless tobacco: Never  Substance and Sexual Activity   Alcohol use: No    Comment: rare   Drug use: No   Sexual activity: Not on file  Other Topics Concern   Not on file  Social History Narrative   Lives with husband   Caffeine- coffee 1 cup daily   Social Drivers of Corporate investment banker Strain: Not on file  Food Insecurity: Not on file  Transportation Needs: Not on file  Physical Activity: Not on file  Stress: Not on file  Social Connections: Not on file  Intimate Partner Violence: Not on file    Lab Results  Component Value Date   HGBA1C 6.5 (A) 12/04/2023   HGBA1C 10.4 (H) 06/26/2023   Lab Results  Component Value Date   CHOL 113  06/26/2023   Lab Results  Component Value Date   HDL 43.00 06/26/2023   Lab Results  Component Value Date   LDLCALC 34 06/26/2023   Lab Results  Component Value Date   TRIG 182.0 (H) 06/26/2023   Lab Results  Component Value Date   CHOLHDL 3 06/26/2023   Lab Results  Component Value Date   CREATININE 0.72 06/26/2023   Lab Results  Component Value Date   GFR 86.59 06/26/2023   Lab Results  Component Value Date   MICROALBUR 2.4 (H) 06/26/2023      Component Value Date/Time   NA 137 06/26/2023 0921   K 4.4 06/26/2023 0921   CL 98 06/26/2023 0921   CO2 29 06/26/2023 0921   GLUCOSE 305 (H) 06/26/2023 0921   BUN 16 06/26/2023 0921   CREATININE 0.72 06/26/2023 0921   CALCIUM 9.9 06/26/2023 0921   PROT 6.6 06/26/2023 0921   ALBUMIN 4.0 06/26/2023 0921   AST 13 06/26/2023 0921   ALT 14 06/26/2023 0921   ALKPHOS 70 06/26/2023 0921   BILITOT 0.3 06/26/2023 0921   GFRNONAA >60 06/14/2016 0413   GFRAA >60 06/14/2016 0413      Latest Ref Rng & Units 06/26/2023    9:21 AM 06/14/2016    4:13 AM 10/17/2012    4:54 AM  BMP  Glucose 70 - 99 mg/dL 161  096  045   BUN 6 - 23 mg/dL 16  17  11    Creatinine 0.40 - 1.20 mg/dL 4.09  8.11  9.14   Sodium 135 - 145 mEq/L 137  138  136   Potassium 3.5 - 5.1 mEq/L 4.4  4.9  4.0   Chloride 96 - 112 mEq/L 98  106  102   CO2 19 - 32 mEq/L 29  26  30    Calcium 8.4 - 10.5 mg/dL 9.9  8.2  8.6        Component Value Date/Time   WBC 14.0 (H) 06/14/2016 0413   RBC 3.29 (L) 06/14/2016 0413   HGB 10.6 (L) 06/14/2016 0413   HCT 31.9 (L) 06/14/2016 0413   PLT 290 06/14/2016 0413   MCV 97.0 06/14/2016 0413   MCH 32.2 06/14/2016 0413   MCHC 33.2 06/14/2016 0413   RDW 14.4 06/14/2016 0413     Parts of this note may have been dictated using voice recognition software. There may be variances in spelling and vocabulary which are unintentional. Not all errors are proofread. Please notify the There[facility] Parkin if any discrepancies are noted or if the  meaning of any statement is not clear.

## 2024-02-08 ENCOUNTER — Encounter: Payer: Self-pay | Admitting: "Endocrinology

## 2024-02-15 ENCOUNTER — Other Ambulatory Visit: Payer: Self-pay | Admitting: "Endocrinology

## 2024-02-15 NOTE — Telephone Encounter (Signed)
 Requested Prescriptions   Pending Prescriptions Disp Refills   Continuous Glucose Sensor (DEXCOM G7 SENSOR) MISC [Pharmacy Med Name: DEXCOM G7 SENSOR] 9 each 1    Sig: CHANGE EVERY 10 DAYS AS DIRECTED

## 2024-02-28 ENCOUNTER — Encounter: Payer: Self-pay | Admitting: "Endocrinology

## 2024-02-28 ENCOUNTER — Ambulatory Visit: Admitting: "Endocrinology

## 2024-02-28 VITALS — BP 116/86 | HR 73 | Ht 61.0 in | Wt 200.0 lb

## 2024-02-28 DIAGNOSIS — Z7985 Long-term (current) use of injectable non-insulin antidiabetic drugs: Secondary | ICD-10-CM

## 2024-02-28 DIAGNOSIS — E1165 Type 2 diabetes mellitus with hyperglycemia: Secondary | ICD-10-CM | POA: Diagnosis not present

## 2024-02-28 DIAGNOSIS — E782 Mixed hyperlipidemia: Secondary | ICD-10-CM | POA: Diagnosis not present

## 2024-02-28 DIAGNOSIS — Z7984 Long term (current) use of oral hypoglycemic drugs: Secondary | ICD-10-CM | POA: Diagnosis not present

## 2024-02-28 MED ORDER — INSULIN LISPRO (1 UNIT DIAL) 100 UNIT/ML (KWIKPEN): 4.0000 [IU] | PEN_INJECTOR | Freq: Three times a day (TID) | SUBCUTANEOUS | 1 refills | Status: AC

## 2024-02-28 NOTE — Progress Notes (Signed)
 Outpatient Endocrinology Note Betty [place], MD  02/28/24   Betty Robinson [birth year] [id number]  Referring Provider: No ref. provider found Primary Care Provider: Juluis Rainier, MD (Inactive) Reason for consultation: Subjective   Assessment & Plan  Diagnoses and all orders for this visit:  Uncontrolled type 2 diabetes mellitus with hyperglycemia (HCC)  Long term (current) use of oral hypoglycemic drugs  Long-term (current) use of injectable non-insulin antidiabetic drugs  Mixed hypercholesterolemia and hypertriglyceridemia  Other orders -     insulin lispro (HUMALOG KWIKPEN) 100 UNIT/ML KwikPen; Inject 4 Units into the skin 3 (three) times daily.   Diabetes Type II complicated by hyperglycemia Lab Results  Component Value Date   GFR 86.59 06/26/2023   Hba1c goal less than 7, current Hba1c is  Lab Results  Component Value Date   HGBA1C 6.5 (A) 12/04/2023   Will recommend the following: Ozempic 2 mg weekly (tried mounjaro 2.5 mg weekly without issues) Metformin 500 XR mg 1 pill bid (max tolerated dose)  Lantus 57 units every day Humalog 4 units 15 min before dinner (and other meals if BG>200)  Wants mounjaro once finishes ozempic  Patient prefers ozempic over Trulicity   No known contraindications/side effects to any of above medications Baqsimi ordered 02/04/24  -Last LD and Tg are as follows: Lab Results  Component Value Date   LDLCALC 34 06/26/2023    Lab Results  Component Value Date   TRIG 182.0 (H) 06/26/2023   -On rosuvastatin 10 mg QD -Follow low fat diet and exercise   -Blood pressure goal <140/90 - Microalbumin/creatinine goal is < 30 -Last MA/Cr is as follows: Lab Results  Component Value Date   MICROALBUR 2.4 (H) 06/26/2023   -on ACE/ARB lisinopril 20 mg qd -diet changes including salt restriction -limit eating outside -counseled BP targets per standards of diabetes care -uncontrolled blood pressure can lead to retinopathy,  nephropathy and cardiovascular and atherosclerotic heart disease  Reviewed and counseled on: -A1C target -Blood sugar targets -Complications of uncontrolled diabetes  -Checking blood sugar before meals and bedtime and bring log next visit -All medications with mechanism of action and side effects -Hypoglycemia management: rule of 15's, Glucagon Emergency Kit and medical alert ID -low-carb low-fat plate-method diet -At least 20 minutes of physical activity per day -Annual dilated retinal eye exam and foot exam -compliance and follow up needs -follow up as scheduled or earlier if problem gets worse  Call if blood sugar is less than 70 or consistently above 250    Take a 15 gm snack of carbohydrate at bedtime before you go to sleep if your blood sugar is less than 100.    If you are going to fast after midnight for a test or procedure, ask your physician for instructions on how to reduce/decrease your insulin dose.    Call if blood sugar is less than 70 or consistently above 250  -Treating a low sugar by rule of 15  (15 gms of sugar every 15 min until sugar is more than 70) If you feel your sugar is low, test your sugar to be sure If your sugar is low (less than 70), then take 15 grams of a fast acting Carbohydrate (3-4 glucose tablets or glucose gel or 4 ounces of juice or regular soda) Recheck your sugar 15 min after treating low to make sure it is more than 70 If sugar is still less than 70, treat again with 15 grams of carbohydrate  Don't drive the hour of hypoglycemia  If unconscious/unable to eat or drink by mouth, use glucagon injection or nasal spray baqsimi and call 911. Can repeat again in 15 min if still unconscious.  Return in about 3 months (around 05/29/2024).   I have reviewed current medications, nurse's notes, allergies, vital signs, past medical and surgical history, family medical history, and social history for this encounter. Counseled patient on symptoms,  examination findings, lab findings, imaging results, treatment decisions and monitoring and prognosis. The patient understood the recommendations and agrees with the treatment plan. All questions regarding treatment plan were fully answered.  Betty [place], MD  02/28/24    History of Present Illness Betty Robinson is a 68 y.o. year old female who presents for follow up of Type II diabetes mellitus.  Betty Robinson was first diagnosed in 2014.   Diabetes education +  Currently on: Lantus 57 units every day  Ozempic 1 mg weekly Metformin XR 500 mg 1 pill bid  No known contraindications/side effects to any of above medications Patient prefers ozempic over Trulicity   COMPLICATIONS -  MI/Stroke -  retinopathy -  neuropathy -  nephropathy  BLOOD SUGAR DATA  CGM interpretation: At today's visit, we reviewed her CGM downloads. The full report is scanned in the media. Reviewing the CGM trends, BG are well controlled with some highs after supper   Physical Exam  BP 116/86   Pulse 73   Ht 5\' 1"  (1.549 m)   Wt 200 lb (90.7 kg)   SpO2 97%   BMI 37.79 kg/m    Constitutional: well developed, well nourished Head: normocephalic, atraumatic Eyes: sclera anicteric, no redness Neck: supple Lungs: normal respiratory effort Neurology: alert and oriented Skin: dry, no appreciable rashes Musculoskeletal: no appreciable defects Psychiatric: normal mood and affect Diabetic Foot Exam - Simple   No data filed      Current Medications Patient's Medications  New Prescriptions   INSULIN LISPRO (HUMALOG KWIKPEN) 100 UNIT/ML KWIKPEN    Inject 4 Units into the skin 3 (three) times daily.  Previous Medications   B COMPLEX VITAMINS TABLET    Take 1 tablet by mouth daily.   CALCIUM CARBONATE (OS-CAL) 600 MG TABS    Take 1,200 mg by mouth 2 (two) times daily with a meal.   CELECOXIB (CELEBREX) 200 MG CAPSULE    200 mg daily.   CONTINUOUS GLUCOSE SENSOR (DEXCOM G7 SENSOR) MISC    CHANGE EVERY 10  DAYS AS DIRECTED   FISH OIL-OMEGA-3 FATTY ACIDS 1000 MG CAPSULE    Take 1 g by mouth daily.   GLUCAGON (BAQSIMI ONE PACK) 3 MG/DOSE POWD    Place 1 Device into the nose as needed (Low blood sugar with impaired consciousness).   GLUCOSAMINE-CHONDROITIN 500-400 MG TABLET    Take 2 tablets by mouth daily at 6 (six) AM.   INSULIN GLARGINE (LANTUS SOLOSTAR) 100 UNIT/ML SOLOSTAR PEN    Inject 48 Units into the skin daily.   LISINOPRIL (ZESTRIL) 20 MG TABLET    Take 20 mg by mouth daily.   LORATADINE (CLARITIN) 10 MG TABLET    Take 10 mg by mouth daily.   MAGNESIUM 250 MG TABS    Take 1 tablet by mouth daily.   METFORMIN (GLUCOPHAGE-XR) 500 MG 24 HR TABLET    Take 2 tablets (1,000 mg total) by mouth 2 (two) times daily with a meal.   MULTIPLE VITAMIN (MULTIVITAMIN WITH MINERALS) TABS    Take 1 tablet by  mouth daily.   ONDANSETRON (ZOFRAN-ODT) 8 MG DISINTEGRATING TABLET    Take 8 mg by mouth every 8 (eight) hours as needed for nausea.   OZEMPIC, 2 MG/DOSE, 8 MG/3ML SOPN    INJECT 2MG  INTO THE SKIN ONCE A WEEK   QUINAPRIL-HYDROCHLOROTHIAZIDE (ACCURETIC) 10-12.5 MG PER TABLET    Take 1 tablet by mouth daily before breakfast.   ROSUVASTATIN (CRESTOR) 10 MG TABLET    Take 10 mg by mouth daily.   SEMAGLUTIDE, 1 MG/DOSE, 4 MG/3ML SOPN    Inject 1 mg as directed once a week.   SERTRALINE (ZOLOFT) 100 MG TABLET    Take 100 mg by mouth daily.   TIRZEPATIDE (MOUNJARO) 2.5 MG/0.5ML PEN    Inject 2.5 mg into the skin once a week.  Modified Medications   No medications on file  Discontinued Medications   No medications on file    Allergies No Known Allergies  Past Medical History Past Medical History:  Diagnosis Date   Arthritis    Diabetes mellitus without complication (HCC)    Hypertension    Nystagmus    PONV (postoperative nausea and vomiting)    per pt vomited after receiving Dilaudid   Vertigo     Past Surgical History Past Surgical History:  Procedure Laterality Date   ADENOIDECTOMY      bilateral knee arthroscopic  R 6 years ago L June 2013   CERVICAL FUSION  2011   C2 C3 C4   KNEE ARTHROSCOPY  10/15/2012   Procedure: ARTHROSCOPY KNEE;  Surgeon: [name], MD;  Location: [facility];  Service: Orthopedics;  Laterality: Left;   Left foot surgery  Dec. 2011   TONSILLECTOMY     TOTAL KNEE ARTHROPLASTY  10/15/2012   Procedure: TOTAL KNEE ARTHROPLASTY;  Surgeon: [name], MD;  Location: [facility];  Service: Orthopedics;  Laterality: Right;   TOTAL KNEE ARTHROPLASTY Left 06/13/2016   Procedure: LEFT TOTAL KNEE ARTHROPLASTY;  Surgeon: Durene Romans, MD;  Location: [facility];  Service: Orthopedics;  Laterality: Left;    Family History family history includes Cancer in her brother; Heart disease in her father.  Social History Social History   Socioeconomic History   Marital status: Married    Spouse name: Chrissie Noa   Number of children: 0   Years of education: Not on file   Highest education level: Bachelor's degree (e.g., BA, AB, BS)  Occupational History    Comment: supervisor  Tobacco Use   Smoking status: Never   Smokeless tobacco: Never  Substance and Sexual Activity   Alcohol use: No    Comment: rare   Drug use: No   Sexual activity: Not on file  Other Topics Concern   Not on file  Social History Narrative   Lives with husband   Caffeine- coffee 1 cup daily   Social Drivers of Health   Financial Resource Strain: Not on file  Food Insecurity: Not on file  Transportation Needs: Not on file  Physical Activity: Not on file  Stress: Not on file  Social Connections: Not on file  Intimate Partner Violence: Not on file    Lab Results  Component Value Date   HGBA1C 6.5 (A) 12/04/2023   HGBA1C 10.4 (H) 06/26/2023   Lab Results  Component Value Date   CHOL 113 06/26/2023   Lab Results  Component Value Date   HDL 43.00 06/26/2023   Lab Results  Component Value Date   LDLCALC 34 06/26/2023   Lab Results  Component Value Date   TRIG 182.0 (H)  06/26/2023   Lab Results  Component Value Date   CHOLHDL 3 06/26/2023   Lab Results  Component Value Date   CREATININE 0.72 06/26/2023   Lab Results  Component Value Date   GFR 86.59 06/26/2023   Lab Results  Component Value Date   MICROALBUR 2.4 (H) 06/26/2023      Component Value Date/Time   NA 137 06/26/2023 0921   K 4.4 06/26/2023 0921   CL 98 06/26/2023 0921   CO2 29 06/26/2023 0921   GLUCOSE 305 (H) 06/26/2023 0921   BUN 16 06/26/2023 0921   CREATININE 0.72 06/26/2023 0921   CALCIUM 9.9 06/26/2023 0921   PROT 6.6 06/26/2023 0921   ALBUMIN 4.0 06/26/2023 0921   AST 13 06/26/2023 0921   ALT 14 06/26/2023 0921   ALKPHOS 70 06/26/2023 0921   BILITOT 0.3 06/26/2023 0921   GFRNONAA >60 06/14/2016 0413   GFRAA >60 06/14/2016 0413      Latest Ref Rng & Units 06/26/2023    9:21 AM 06/14/2016    4:13 AM 10/17/2012    4:54 AM  BMP  Glucose 70 - 99 mg/dL 161  096  045   BUN 6 - 23 mg/dL 16  17  11    Creatinine 0.40 - 1.20 mg/dL 4.09  8.11  9.14   Sodium 135 - 145 mEq/L 137  138  136   Potassium 3.5 - 5.1 mEq/L 4.4  4.9  4.0   Chloride 96 - 112 mEq/L 98  106  102   CO2 19 - 32 mEq/L 29  26  30    Calcium 8.4 - 10.5 mg/dL 9.9  8.2  8.6        Component Value Date/Time   WBC 14.0 (H) 06/14/2016 0413   RBC 3.29 (L) 06/14/2016 0413   HGB 10.6 (L) 06/14/2016 0413   HCT 31.9 (L) 06/14/2016 0413   PLT 290 06/14/2016 0413   MCV 97.0 06/14/2016 0413   MCH 32.2 06/14/2016 0413   MCHC 33.2 06/14/2016 0413   RDW 14.4 06/14/2016 0413     Parts of this note may have been dictated using voice recognition software. There may be variances in spelling and vocabulary which are unintentional. Not all errors are proofread. Please notify the [facility] Parkin if any discrepancies are noted or if the meaning of any statement is not clear.

## 2024-02-28 NOTE — Patient Instructions (Signed)

## 2024-03-04 DIAGNOSIS — N3281 Overactive bladder: Secondary | ICD-10-CM | POA: Diagnosis not present

## 2024-03-04 DIAGNOSIS — I10 Essential (primary) hypertension: Secondary | ICD-10-CM | POA: Diagnosis not present

## 2024-03-04 DIAGNOSIS — E7800 Pure hypercholesterolemia, unspecified: Secondary | ICD-10-CM | POA: Diagnosis not present

## 2024-03-04 DIAGNOSIS — E1165 Type 2 diabetes mellitus with hyperglycemia: Secondary | ICD-10-CM | POA: Diagnosis not present

## 2024-03-06 ENCOUNTER — Ambulatory Visit: Admitting: "Endocrinology

## 2024-03-11 DIAGNOSIS — E119 Type 2 diabetes mellitus without complications: Secondary | ICD-10-CM | POA: Diagnosis not present

## 2024-03-11 DIAGNOSIS — E1169 Type 2 diabetes mellitus with other specified complication: Secondary | ICD-10-CM | POA: Diagnosis not present

## 2024-03-11 DIAGNOSIS — E559 Vitamin D deficiency, unspecified: Secondary | ICD-10-CM | POA: Diagnosis not present

## 2024-03-11 DIAGNOSIS — I10 Essential (primary) hypertension: Secondary | ICD-10-CM | POA: Diagnosis not present

## 2024-03-13 ENCOUNTER — Encounter: Payer: Self-pay | Admitting: "Endocrinology

## 2024-04-01 DIAGNOSIS — H5213 Myopia, bilateral: Secondary | ICD-10-CM | POA: Diagnosis not present

## 2024-04-10 DIAGNOSIS — R031 Nonspecific low blood-pressure reading: Secondary | ICD-10-CM | POA: Diagnosis not present

## 2024-04-10 DIAGNOSIS — R195 Other fecal abnormalities: Secondary | ICD-10-CM | POA: Diagnosis not present

## 2024-06-03 ENCOUNTER — Ambulatory Visit: Admitting: "Endocrinology

## 2024-07-04 DIAGNOSIS — M79645 Pain in left finger(s): Secondary | ICD-10-CM | POA: Diagnosis not present

## 2024-07-04 DIAGNOSIS — M79642 Pain in left hand: Secondary | ICD-10-CM | POA: Diagnosis not present

## 2024-07-15 DIAGNOSIS — I10 Essential (primary) hypertension: Secondary | ICD-10-CM | POA: Diagnosis not present

## 2024-07-15 DIAGNOSIS — E119 Type 2 diabetes mellitus without complications: Secondary | ICD-10-CM | POA: Diagnosis not present

## 2024-07-15 DIAGNOSIS — E1169 Type 2 diabetes mellitus with other specified complication: Secondary | ICD-10-CM | POA: Diagnosis not present

## 2024-08-04 ENCOUNTER — Other Ambulatory Visit: Payer: Self-pay | Admitting: "Endocrinology

## 2024-08-04 NOTE — Telephone Encounter (Signed)
 Refill request complete

## 2024-08-26 DIAGNOSIS — K080 Exfoliation of teeth due to systemic causes: Secondary | ICD-10-CM | POA: Diagnosis not present

## 2024-08-27 DIAGNOSIS — R590 Localized enlarged lymph nodes: Secondary | ICD-10-CM | POA: Diagnosis not present

## 2024-08-27 DIAGNOSIS — R062 Wheezing: Secondary | ICD-10-CM | POA: Diagnosis not present

## 2024-10-02 DIAGNOSIS — M1611 Unilateral primary osteoarthritis, right hip: Secondary | ICD-10-CM | POA: Diagnosis not present

## 2024-10-02 DIAGNOSIS — Z96653 Presence of artificial knee joint, bilateral: Secondary | ICD-10-CM | POA: Diagnosis not present

## 2024-10-02 DIAGNOSIS — Z471 Aftercare following joint replacement surgery: Secondary | ICD-10-CM | POA: Diagnosis not present

## 2024-12-13 ENCOUNTER — Other Ambulatory Visit: Payer: Self-pay | Admitting: "Endocrinology

## 2024-12-13 DIAGNOSIS — E119 Type 2 diabetes mellitus without complications: Secondary | ICD-10-CM
